# Patient Record
Sex: Female | Born: 2000 | Race: White | Hispanic: No | Smoking: Never smoker
Health system: Southern US, Community
[De-identification: ages and names within clinical notes are randomized; demographics above are authoritative.]

## PROBLEM LIST (undated history)

## (undated) DIAGNOSIS — F909 Attention-deficit hyperactivity disorder, unspecified type: Secondary | ICD-10-CM

## (undated) DIAGNOSIS — R51 Headache: Secondary | ICD-10-CM

## (undated) DIAGNOSIS — G039 Meningitis, unspecified: Secondary | ICD-10-CM

## (undated) DIAGNOSIS — B019 Varicella without complication: Secondary | ICD-10-CM

## (undated) DIAGNOSIS — F411 Generalized anxiety disorder: Secondary | ICD-10-CM

## (undated) DIAGNOSIS — R197 Diarrhea, unspecified: Secondary | ICD-10-CM

## (undated) HISTORY — DX: Generalized anxiety disorder: F41.1

## (undated) HISTORY — DX: Diarrhea, unspecified: R19.7

---
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
## 2020-01-26 DIAGNOSIS — Z113 Encounter for screening for infections with a predominantly sexual mode of transmission: Secondary | ICD-10-CM | POA: Diagnosis not present

## 2020-02-09 ENCOUNTER — Encounter: Payer: Self-pay | Admitting: *Deleted

## 2020-02-09 ENCOUNTER — Other Ambulatory Visit: Payer: Self-pay

## 2020-02-09 DIAGNOSIS — Z20822 Contact with and (suspected) exposure to covid-19: Secondary | ICD-10-CM | POA: Diagnosis present

## 2020-02-09 DIAGNOSIS — Z7722 Contact with and (suspected) exposure to environmental tobacco smoke (acute) (chronic): Secondary | ICD-10-CM | POA: Diagnosis present

## 2020-02-09 DIAGNOSIS — N730 Acute parametritis and pelvic cellulitis: Secondary | ICD-10-CM | POA: Diagnosis not present

## 2020-02-09 DIAGNOSIS — N83201 Unspecified ovarian cyst, right side: Secondary | ICD-10-CM | POA: Diagnosis not present

## 2020-02-09 DIAGNOSIS — R109 Unspecified abdominal pain: Secondary | ICD-10-CM | POA: Diagnosis not present

## 2020-02-09 DIAGNOSIS — N739 Female pelvic inflammatory disease, unspecified: Secondary | ICD-10-CM | POA: Diagnosis not present

## 2020-02-09 DIAGNOSIS — F419 Anxiety disorder, unspecified: Secondary | ICD-10-CM | POA: Diagnosis not present

## 2020-02-09 DIAGNOSIS — R1030 Lower abdominal pain, unspecified: Secondary | ICD-10-CM | POA: Diagnosis not present

## 2020-02-09 DIAGNOSIS — N7093 Salpingitis and oophoritis, unspecified: Principal | ICD-10-CM | POA: Diagnosis present

## 2020-02-09 LAB — CBC
HCT: 35.3 % — ABNORMAL LOW (ref 36.0–46.0)
Hemoglobin: 11.5 g/dL — ABNORMAL LOW (ref 12.0–15.0)
MCH: 26.9 pg (ref 26.0–34.0)
MCHC: 32.6 g/dL (ref 30.0–36.0)
MCV: 82.5 fL (ref 80.0–100.0)
Platelets: 279 10*3/uL (ref 150–400)
RBC: 4.28 MIL/uL (ref 3.87–5.11)
RDW: 13.1 % (ref 11.5–15.5)
WBC: 9.7 10*3/uL (ref 4.0–10.5)
nRBC: 0 % (ref 0.0–0.2)

## 2020-02-09 LAB — COMPREHENSIVE METABOLIC PANEL
ALT: 22 U/L (ref 0–44)
AST: 17 U/L (ref 15–41)
Albumin: 3.8 g/dL (ref 3.5–5.0)
Alkaline Phosphatase: 66 U/L (ref 38–126)
Anion gap: 11 (ref 5–15)
BUN: 8 mg/dL (ref 6–20)
CO2: 22 mmol/L (ref 22–32)
Calcium: 9 mg/dL (ref 8.9–10.3)
Chloride: 100 mmol/L (ref 98–111)
Creatinine, Ser: 0.82 mg/dL (ref 0.44–1.00)
GFR, Estimated: >60 mL/min (ref >60)
Glucose, Bld: 116 mg/dL — ABNORMAL HIGH (ref 70–99)
Potassium: 3.8 mmol/L (ref 3.5–5.1)
Sodium: 133 mmol/L — ABNORMAL LOW (ref 135–145)
Total Bilirubin: 0.7 mg/dL (ref 0.3–1.2)
Total Protein: 7.7 g/dL (ref 6.5–8.1)

## 2020-02-09 LAB — URINALYSIS, COMPLETE (UACMP) WITH MICROSCOPIC
Bacteria, UA: NONE SEEN
Bilirubin Urine: NEGATIVE
Glucose, UA: NEGATIVE mg/dL
Ketones, ur: NEGATIVE mg/dL
Nitrite: NEGATIVE
Protein, ur: NEGATIVE mg/dL
Specific Gravity, Urine: 1.018 (ref 1.005–1.030)
pH: 6 (ref 5.0–8.0)

## 2020-02-09 LAB — PREGNANCY, URINE: Preg Test, Ur: NEGATIVE

## 2020-02-09 LAB — LIPASE, BLOOD: Lipase: 19 U/L (ref 11–51)

## 2020-02-09 MED ORDER — ACETAMINOPHEN 325 MG PO TABS
650.0000 mg | ORAL_TABLET | Freq: Once | ORAL | Status: AC | PRN
  Administered 2020-02-09: 650 mg via ORAL
  Filled 2020-02-09: qty 2

## 2020-02-09 NOTE — ED Triage Notes (Signed)
Pt to ED reporting abd pain x 1 week. Pt originally thought she was constipated and took Mirilax without relief. Pt has since started to develop a fever, lightheadedness and nausea without vomiting. Pt denies having taken medication for her fever today.   Pt reporting the pain feels similar to when she had her IUD placed. Stabbing lower abd but pt denies having vaginal pain.

## 2020-02-10 ENCOUNTER — Inpatient Hospital Stay: Payer: BC Managed Care – PPO

## 2020-02-10 ENCOUNTER — Emergency Department: Payer: BC Managed Care – PPO

## 2020-02-10 ENCOUNTER — Inpatient Hospital Stay
Admission: EM | Admit: 2020-02-10 | Discharge: 2020-02-14 | DRG: 747 | Disposition: A | Discharge status: Home / Self Care | Payer: BC Managed Care – PPO | Attending: Obstetrics & Gynecology | Admitting: Obstetrics & Gynecology

## 2020-02-10 DIAGNOSIS — F419 Anxiety disorder, unspecified: Secondary | ICD-10-CM | POA: Diagnosis present

## 2020-02-10 DIAGNOSIS — R1030 Lower abdominal pain, unspecified: Secondary | ICD-10-CM | POA: Diagnosis present

## 2020-02-10 DIAGNOSIS — Z7722 Contact with and (suspected) exposure to environmental tobacco smoke (acute) (chronic): Secondary | ICD-10-CM | POA: Diagnosis present

## 2020-02-10 DIAGNOSIS — N730 Acute parametritis and pelvic cellulitis: Secondary | ICD-10-CM | POA: Diagnosis not present

## 2020-02-10 DIAGNOSIS — N7093 Salpingitis and oophoritis, unspecified: Secondary | ICD-10-CM

## 2020-02-10 DIAGNOSIS — Z20822 Contact with and (suspected) exposure to covid-19: Secondary | ICD-10-CM | POA: Diagnosis present

## 2020-02-10 LAB — CHLAMYDIA/NGC RT PCR (ARMC ONLY)
Chlamydia Tr: NOT DETECTED
N gonorrhoeae: NOT DETECTED

## 2020-02-10 LAB — RESP PANEL BY RT-PCR (FLU A&B, COVID) ARPGX2
Influenza A by PCR: NEGATIVE
Influenza B by PCR: NEGATIVE
SARS Coronavirus 2 by RT PCR: NEGATIVE

## 2020-02-10 LAB — WET PREP, GENITAL
Sperm: NONE SEEN
Trich, Wet Prep: NONE SEEN
Yeast Wet Prep HPF POC: NONE SEEN

## 2020-02-10 MED ORDER — LORAZEPAM 2 MG/ML IJ SOLN: 0.5000 mg | Freq: Once | INTRAMUSCULAR | Status: DC

## 2020-02-10 MED ORDER — SODIUM CHLORIDE 0.9 % IV SOLN
3.0000 g | Freq: Four times a day (QID) | INTRAVENOUS | Status: DC
  Administered 2020-02-10 – 2020-02-14 (×18): 3 g via INTRAVENOUS
  Filled 2020-02-10: qty 8
  Filled 2020-02-10: qty 3
  Filled 2020-02-10 (×3): qty 8
  Filled 2020-02-10 (×2): qty 3
  Filled 2020-02-10: qty 8
  Filled 2020-02-10 (×7): qty 3
  Filled 2020-02-10 (×2): qty 8
  Filled 2020-02-10 (×4): qty 3

## 2020-02-10 MED ORDER — ZOLPIDEM TARTRATE 5 MG PO TABS
5.0000 mg | ORAL_TABLET | Freq: Every evening | ORAL | Status: DC | PRN
  Administered 2020-02-13: 5 mg via ORAL
  Filled 2020-02-10: qty 1

## 2020-02-10 MED ORDER — LACTATED RINGERS IV SOLN: INTRAVENOUS | Status: DC

## 2020-02-10 MED ORDER — OXYCODONE-ACETAMINOPHEN 5-325 MG PO TABS
1.0000 | ORAL_TABLET | ORAL | Status: DC | PRN
Start: 2020-02-10 — End: 2020-02-10

## 2020-02-10 MED ORDER — MORPHINE SULFATE (PF) 2 MG/ML IV SOLN
1.0000 mg | INTRAVENOUS | Status: DC | PRN
  Administered 2020-02-10: 2 mg via INTRAVENOUS
  Filled 2020-02-10: qty 1

## 2020-02-10 MED ORDER — MORPHINE SULFATE (PF) 4 MG/ML IV SOLN
4.0000 mg | Freq: Once | INTRAVENOUS | Status: AC
Start: 2020-02-10 — End: 2020-02-10
  Administered 2020-02-10: 02:00:00 4 mg via INTRAVENOUS
  Filled 2020-02-10: qty 1

## 2020-02-10 MED ORDER — IOHEXOL 350 MG/ML SOLN
75.0000 mL | Freq: Once | INTRAVENOUS | Status: AC | PRN
  Administered 2020-02-10: 02:00:00 75 mL via INTRAVENOUS

## 2020-02-10 MED ORDER — OXYCODONE HCL 5 MG PO TABS
5.0000 mg | ORAL_TABLET | ORAL | Status: DC | PRN
  Administered 2020-02-10 – 2020-02-11 (×5): 5 mg via ORAL
  Filled 2020-02-10 (×6): qty 1

## 2020-02-10 MED ORDER — ALPRAZOLAM 0.5 MG PO TABS
0.5000 mg | ORAL_TABLET | Freq: Two times a day (BID) | ORAL | Status: DC | PRN
  Administered 2020-02-10 – 2020-02-14 (×4): 0.5 mg via ORAL
  Filled 2020-02-10 (×5): qty 1

## 2020-02-10 MED ORDER — LACTATED RINGERS IV BOLUS
1000.0000 mL | Freq: Once | INTRAVENOUS | Status: AC
  Administered 2020-02-10: 02:00:00 1000 mL via INTRAVENOUS

## 2020-02-10 MED ORDER — DOXYCYCLINE HYCLATE 100 MG PO TABS
100.0000 mg | ORAL_TABLET | Freq: Two times a day (BID) | ORAL | Status: DC
  Administered 2020-02-10 – 2020-02-14 (×8): 100 mg via ORAL
  Filled 2020-02-10 (×10): qty 1

## 2020-02-10 MED ORDER — PRENATAL PLUS 27-1 MG PO TABS
1.0000 | ORAL_TABLET | Freq: Every day | ORAL | Status: DC
  Administered 2020-02-13 – 2020-02-14 (×2): 1 via ORAL
  Filled 2020-02-10 (×3): qty 1

## 2020-02-10 MED ORDER — SODIUM CHLORIDE 0.9 % IV SOLN
100.0000 mg | Freq: Once | INTRAVENOUS | Status: AC
  Administered 2020-02-10: 06:00:00 100 mg via INTRAVENOUS
  Filled 2020-02-10: qty 100

## 2020-02-10 MED ORDER — ACETAMINOPHEN 325 MG PO TABS
650.0000 mg | ORAL_TABLET | ORAL | Status: DC | PRN
  Administered 2020-02-10 – 2020-02-14 (×10): 650 mg via ORAL
  Filled 2020-02-10 (×12): qty 2

## 2020-02-10 NOTE — H&P (Signed)
Obstetrics & Gynecology History and Physical Note  Date of Consultation: 02/10/2020   Requesting Provider: [facility]  Primary OBGYN: [facility] Primary Care Provider: System, Provider Not In  Reason for Consultation: Pain  History of Present Illness: [name] Green is a 19 y.o. G0 (No LMP recorded.), with the above CC. Pt presents with c/o lower abdominal pain that has been present for one week but acutely worsened during the night.  Described as sharp, constant, no radiation, associated with fever, chills, nausea.  No modifiers, came to ER.  No VB or discharge.  Prior h/o: IUD placed 11/2018, no problems w IUD. Chlamydia infection dx and tx 02/2019. No prior pregnancies. No prior abdominal surgeries. Colitis dx and treated few years ago, but no longer on meds nor has sx's from this.  ROS: A review of systems was performed and was complete and comprehensive, except as stated in the above HPI.  OBGYN History: As per HPI. OB History   No obstetric history on file.      Past Medical History: Past Medical History:  Diagnosis Date  . ADHD (attention deficit hyperactivity disorder)   . Anxiety state 01/01/2018  . Asthma   . Bloody diarrhea    With mucous  . Headache(784.0)    Tues and Fever-100.6  . Meningitis    as an inafant  . Varicella     Past Surgical History: Past Surgical History:  Procedure Laterality Date  . ADENOIDECTOMY  2008  . COLONOSCOPY  04/18/2011   Procedure: COLONOSCOPY;  Surgeon: Jon Gills, MD;  Location: [facility] OR;  Service: Gastroenterology;  Laterality: N/A;  . TONSILLECTOMY  2008    Family History:  Family History  Problem Relation Age of Onset  . ADD / ADHD Sister   . Anxiety disorder Sister   . Anxiety disorder Sister   . ADD / ADHD Brother   . Anxiety disorder Brother   . Ulcers Father   . ADD / ADHD Mother   . Anxiety disorder Mother   . Migraines Maternal Grandmother   . Depression Paternal Grandmother   . Bipolar disorder Paternal Grandfather    . Seizures Neg Hx   . Autism Neg Hx   . Schizophrenia Neg Hx    She denies any female cancers, bleeding or blood clotting disorders.   Social History:  Social History   Socioeconomic History  . Marital status: Single    Spouse name: Not on file  . Number of children: Not on file  . Years of education: Not on file  . Highest education level: Not on file  Occupational History  . Not on file  Tobacco Use  . Smoking status: Passive Smoke Exposure - Never Smoker  . Smokeless tobacco: Never Used  Substance and Sexual Activity  . Alcohol use: No  . Drug use: No  . Sexual activity: Never  Other Topics Concern  . Not on file  Social History Narrative   Lives at home with mom, and twin sister. She is in the 12th grade at [employer]. She enjoys cheering eating and hanging out with friends.    Social Determinants of Health   Financial Resource Strain: Not on file  Food Insecurity: Not on file  Transportation Needs: Not on file  Physical Activity: Not on file  Stress: Not on file  Social Connections: Not on file  Intimate Partner Violence: Not on file    Allergy: Allergies  Allergen Reactions  . Sulfur Rash    Current Outpatient  Medications: (Not in a hospital admission)   Hospital Medications: Current Facility-Administered Medications  Medication Dose Route Frequency Provider Last Rate Last Admin  . acetaminophen (TYLENOL) tablet 650 mg  650 mg Oral Q4H PRN Nadara Mustard, MD      . ALPRAZolam Prudy Feeler) tablet 0.5 mg  0.5 mg Oral BID PRN Nadara Mustard, MD      . Ampicillin-Sulbactam (UNASYN) 3 g in sodium chloride 0.9 % 100 mL IVPB  3 g Intravenous Q6H Nadara Mustard, MD 200 mL/hr at 02/10/20 0405 3 g at 02/10/20 0405  . doxycycline (VIBRAMYCIN) 100 mg in sodium chloride 0.9 % 250 mL IVPB  100 mg Intravenous Once Nadara Mustard, MD      . lactated ringers infusion   Intravenous Continuous Nadara Mustard, MD      . morphine 2 MG/ML injection 1-2 mg   1-2 mg Intravenous Q3H PRN Nadara Mustard, MD      . oxyCODONE-acetaminophen (PERCOCET/ROXICET) 5-325 MG per tablet 1-2 tablet  1-2 tablet Oral Q3H PRN Nadara Mustard, MD      . prenatal multivitamin tablet 1 tablet  1 tablet Oral Q1200 Nadara Mustard, MD      . zolpidem (AMBIEN) tablet 5 mg  5 mg Oral QHS PRN Nadara Mustard, MD       Current Outpatient Medications  Medication Sig Dispense Refill  . polyethylene glycol (MIRALAX / GLYCOLAX) 17 g packet Take 17 g by mouth daily.      Physical Exam: Vitals:   02/09/20 1916 02/09/20 2231 02/10/20 0106 02/10/20 0201  BP:  (!) 118/56  (!) 122/56  Pulse:  (!) 102  (!) 103  Resp:  18  16  Temp:  99.1 F (37.3 C) 99.2 F (37.3 C)   TempSrc:  Oral Oral   SpO2:  98%  100%  Weight: 90.7 kg     Height: 5\' 2"  (1.575 m)       Temp:  [99.1 F (37.3 C)-101.3 F (38.5 C)] 99.2 F (37.3 C) (12/17 0106) Pulse Rate:  [102-136] 103 (12/17 0201) Resp:  [16-18] 16 (12/17 0201) BP: (118-122)/(56-68) 122/56 (12/17 0201) SpO2:  [97 %-100 %] 100 % (12/17 0201) Weight:  [90.7 kg] 90.7 kg (12/16 1916) No intake/output data recorded. No intake/output data recorded. No intake or output data in the 24 hours ending 02/10/20 0427  Body mass index is 36.58 kg/m. Constitutional: Well nourished, well developed female in no acute distress.  HEENT: normal Neck:  Supple, normal appearance, and no thyromegaly  Cardiovascular:Regular rate and rhythm.  No murmurs, rubs or gallops. Respiratory:  Clear to auscultation bilateral. Normal respiratory effort Abdomen: positive bowel sounds and no masses, hernias; diffusely non tender to palpation, non distended Neuro: grossly intact Psych:  Normal mood and affect.  Skin:  Warm and dry.  MS: normal gait and normal bilateral lower extremity strength/ROM/symmetry Lymphatic:  No inguinal lymphadenopathy.   Pelvic exam: deferred by self MD exam (ER) CMT; no discharge  Recent Labs  Lab 02/09/20 1918  WBC  9.7  HGB 11.5*  HCT 35.3*  PLT 279   Recent Labs  Lab 02/09/20 1918  NA 133*  K 3.8  CL 100  CO2 22  BUN 8  CREATININE 0.82  CALCIUM 9.0  PROT 7.7  BILITOT 0.7  ALKPHOS 66  ALT 22  AST 17  GLUCOSE 116*  uCG NEG  Imaging:  CT Reproductive: The right fallopian flu is dilated, thick-walled, and demonstrates mild  peritubal inflammatory stranding in keeping with a probable pyosalpinx/tubo-ovarian abscess. Small internal septa within the dilated fallopian tube or best appreciated on sagittal image # 62. The ovary is best appreciated on axial image # 62 and coronal image # 47 adjacent to the dilated, inflamed fallopian tube. The developing abscess measures roughly 4.7 x 5.2 by 4.6 cm in greatest dimension. Intrauterine device noted within the endometrial cavity. Left adnexa is unremarkable.  Assessment: Ms. Fulco is a 19 y.o. G0 (No LMP recorded.) who presented to the ED with complaints of pelvic pain; findings are consistent with Tubo Ovarian Abscess.  Plan: Admission status due to pain, nausea, abscess diagnosed by CT IVF, IV ABX    Regimen- Unasyn and Doxy    Plan for 48 hours then convert to po Doxy for 14 day outpatient therapy Monitor for fever and pain Surgery option discussed, deferred 48 hours and if improved symptoms, then no surgery    If worsens in sx's, or growth in TOA as demonstrated by [place], then surgery [place] for baseline today Diet, ambuilation  Discussed likely etiology to be chlamydia.  Expectations for therapy, recovery, future fertility, PID and CPP, all discussed.  Mother present for discussion as well.  IUD in proper location (by CT), no need to remove at this time.  UTI, BV suggested by labs.  Broad spectrum ABX to cover.  No sx's related to these, may be cross contaminations.  Anxiety, will have prn meds to help while hospital bound.  Annamarie Major, MD, [facility], [facility] 02/10/2020  4:27 AM Pager [phone]

## 2020-02-10 NOTE — ED Notes (Signed)
See triage note. Pt denies pain with urination, last bowel movement was yesterday. Pt states normal for her is every two day. Pt had Marena IUD placed approx 1 year ago and states current pain is similar to that experience after placement. Pt has had intermittent fever for last 48 hours.

## 2020-02-10 NOTE — ED Notes (Signed)
Report to Kennedi, RN

## 2020-02-10 NOTE — ED Provider Notes (Signed)
[facility] Emergency Department Provider Note   ____________________________________________   Event Date/Time   First MD Initiated Contact with Patient 02/10/20 0112     (approximate)  I have reviewed the triage vital signs and the nursing notes.   HISTORY  Chief Complaint Abdominal Pain    HPI [name] Green is a 19 y.o. female with past medical history of asthma, anxiety, and ADHD who presents to the ED complaining of abdominal pain.  Patient reports she has had approximately 1 week of gradually worsening pain to her bilateral pelvic area.  Pain is described as sharp and constant, not exacerbated or alleviated by anything.  She initially thought she might be constipated, but she took MiraLAX and had a bowel movement with no relief.  She started having chills and lightheadedness earlier today, mom then took her temperature and found it to be 102.  She has had some nausea, but denies any vomiting or changes in bowel movements.  She states the pain in her abdomen seems to be worse when she goes to pee, but she does not have any dysuria or hematuria.  She denies any vaginal bleeding or discharge.        Past Medical History:  Diagnosis Date  . ADHD (attention deficit hyperactivity disorder)   . Anxiety state 01/01/2018  . Asthma   . Bloody diarrhea    With mucous  . Headache(784.0)    Tues and Fever-100.6  . Meningitis    as an inafant  . Varicella     Patient Active Problem List   Diagnosis Date Noted  . Postconcussion syndrome 01/01/2018  . Anxiety state 01/01/2018  . Sleeping difficulty 01/01/2018  . Left sided colitis (HCC) 02/02/2012  . Abnormal urine odor 01/19/2012  . Stool culture positive for Clostridium difficile 11/26/2011  . Simple constipation 11/24/2011  . Intestinal bacterial overgrowth 10/13/2011  . Generalized abdominal pain 09/22/2011  . Bloody diarrhea     Past Surgical History:  Procedure Laterality Date  .  ADENOIDECTOMY  2008  . COLONOSCOPY  04/18/2011   Procedure: COLONOSCOPY;  Surgeon: Jon Gills, MD;  Location: [facility] OR;  Service: Gastroenterology;  Laterality: N/A;  . TONSILLECTOMY  2008    Prior to Admission medications   Medication Sig Start Date End Date Taking? Authorizing Provider  albuterol (PROVENTIL HFA;VENTOLIN HFA) 108 (90 BASE) MCG/ACT inhaler Inhale 2 puffs into the lungs every 6 (six) hours as needed. For shortness of breath     [provider]  cetirizine (ZYRTEC) 10 MG tablet TAKE 1 TABLET BY MOUTH ONCE DAILY 06/19/16   [provider]  cholestyramine Lanetta Inch) 4 GM/DOSE powder Take 0.5 packets (2 g total) by mouth 2 (two) times daily with a meal. 02/05/12 02/04/13  Jon Gills, MD  dexmethylphenidate (FOCALIN XR) 10 MG 24 hr capsule Take 10 mg by mouth daily.      [provider]  escitalopram (LEXAPRO) 5 MG tablet TAKE 1 TABLET(S) BY MOUTH AT BEDTIME FOR ANXIETY/DEPRESSION 12/24/17   [provider]  famotidine (PEPCID) 20 MG tablet TAKE 1 TABLET BY MOUTH DAILY 06/16/16   [provider]  Fiber CHEW Chew by mouth.    [provider]  hydrOXYzine (ATARAX/VISTARIL) 25 MG tablet TAKE 1 TABLET(S) BY MOUTH TWICE A DAY AS NEEDED FOR ANXIETY/SLEEP 12/24/17   [provider]  ibuprofen (ADVIL,MOTRIN) 600 MG tablet Take by mouth. 10/08/14   [provider]  levonorgestrel-ethinyl estradiol (LILLOW) 0.15-30 MG-MCG tablet TAKE 1  TABLET BY MOUTH ONCE DAILY 07/01/16   [provider]  mesalamine (PENTASA) 500 MG CR capsule Take 1 capsule (500 mg total) by mouth 3 (three) times daily. 12/25/11 12/24/12  Jon Gills, MD  montelukast (SINGULAIR) 5 MG chewable tablet Chew 5 mg by mouth at bedtime.      [provider]  pantoprazole (PROTONIX) 20 MG tablet Take by mouth. 07/07/17   [provider]  saccharomyces boulardii (FLORASTOR) 250 MG capsule Take 1 capsule (250 mg total) by mouth 2  (two) times daily. 02/02/12 02/01/13  Jon Gills, MD    Allergies Sulfur  Family History  Problem Relation Age of Onset  . ADD / ADHD Sister   . Anxiety disorder Sister   . Anxiety disorder Sister   . ADD / ADHD Brother   . Anxiety disorder Brother   . Ulcers Father   . ADD / ADHD Mother   . Anxiety disorder Mother   . Migraines Maternal Grandmother   . Depression Paternal Grandmother   . Bipolar disorder Paternal Grandfather   . Seizures Neg Hx   . Autism Neg Hx   . Schizophrenia Neg Hx     Social History Social History   Tobacco Use  . Smoking status: Passive Smoke Exposure - Never Smoker  . Smokeless tobacco: Never Used  Substance Use Topics  . Alcohol use: No  . Drug use: No    Review of Systems  Constitutional: Positive for fever/chills Eyes: No visual changes. ENT: No sore throat. Cardiovascular: Denies chest pain. Respiratory: Denies shortness of breath. Gastrointestinal: Positive for pelvic and abdominal pain.  Positive for nausea, no vomiting.  No diarrhea.  No constipation. Genitourinary: Negative for dysuria. Musculoskeletal: Negative for back pain. Skin: Negative for rash. Neurological: Negative for headaches, focal weakness or numbness.  ____________________________________________   PHYSICAL EXAM:  VITAL SIGNS: ED Triage Vitals  Enc Vitals Group     BP 02/09/20 1915 121/68     Pulse Rate 02/09/20 1915 (!) 136     Resp 02/09/20 1915 16     Temp 02/09/20 1915 (!) 101.3 F (38.5 C)     Temp Source 02/09/20 1915 Oral     SpO2 02/09/20 1915 97 %     Weight 02/09/20 1916 200 lb (90.7 kg)     Height 02/09/20 1916 5\' 2"  (1.575 m)     Head Circumference --      Peak Flow --      Pain Score 02/09/20 1916 8     Pain Loc --      Pain Edu? --      Excl. in GC? --     Constitutional: Alert and oriented. Eyes: Conjunctivae are normal. Head: Atraumatic. Nose: No congestion/rhinnorhea. Mouth/Throat: Mucous membranes are moist. Neck: Normal  ROM Cardiovascular: Normal rate, regular rhythm. Grossly normal heart sounds. Respiratory: Normal respiratory effort.  No retractions. Lungs CTAB. Gastrointestinal: Soft and tender to palpation in the bilateral lower quadrants with no rebound or guarding. No distention. Genitourinary: Thick yellow discharge noted with cervical motion tenderness, no adnexal tenderness. Musculoskeletal: No lower extremity tenderness nor edema. Neurologic:  Normal speech and language. No gross focal neurologic deficits are appreciated. Skin:  Skin is warm, dry and intact. No rash noted. Psychiatric: Mood and affect are normal. Speech and behavior are normal.  ____________________________________________   LABS (all labs ordered are listed, but only abnormal results are displayed)  Labs Reviewed  COMPREHENSIVE METABOLIC PANEL - Abnormal; Notable for the following components:  Result Value   Sodium 133 (*)    Glucose, Bld 116 (*)    All other components within normal limits  CBC - Abnormal; Notable for the following components:   Hemoglobin 11.5 (*)    HCT 35.3 (*)    All other components within normal limits  URINALYSIS, COMPLETE (UACMP) WITH MICROSCOPIC - Abnormal; Notable for the following components:   Color, Urine YELLOW (*)    APPearance HAZY (*)    Hgb urine dipstick SMALL (*)    Leukocytes,Ua SMALL (*)    All other components within normal limits  WET PREP, GENITAL  CHLAMYDIA/NGC RT PCR ([facility] ONLY)  CULTURE, BLOOD (ROUTINE X 2)  CULTURE, BLOOD (ROUTINE X 2)  RESP PANEL BY RT-PCR (FLU A&B, COVID) ARPGX2  LIPASE, BLOOD  PREGNANCY, URINE  LACTIC ACID, PLASMA  LACTIC ACID, PLASMA   PROCEDURES  Procedure(s) performed (including Critical Care):  Procedures   ____________________________________________   INITIAL IMPRESSION / ASSESSMENT AND PLAN / ED COURSE       19 year old female with past medical history of asthma, anxiety, and ADHD who presents to the ED  complaining of 1 week of gradually worsening bilateral lower pelvic pain and fever starting earlier today.  Patient noted to be febrile and tachycardic upon arrival to the ED, although she was given Tylenol and fever and tachycardia have since improved.  She does have focal tenderness to bilateral lower quadrants, pelvic exam reveals cervical motion tenderness with purulent discharge.  CT scan was performed and shows fluid collection concerning for tubo-ovarian abscess.  We will add on blood cultures and treat with Unasyn and doxycycline.  Case discussed with Dr. Tiburcio Pea of OB/GYN, who accepts patient for admission.      ____________________________________________   FINAL CLINICAL IMPRESSION(S) / ED DIAGNOSES  Final diagnoses:  TOA (tubo-ovarian abscess)  PID (acute pelvic inflammatory disease)     ED Discharge Orders    None       Note:  This document was prepared using Dragon voice recognition software and may include unintentional dictation errors.   Chesley Noon, MD 02/10/20 [id number]

## 2020-02-10 NOTE — Progress Notes (Signed)
Pt feeling much better this afternoon after treating her fever with Tylenol and getting the antibiotics started. She has been able to eat lunch and drink lots of water. She has rested quite a bit. Planning to shower now.   Pain is tolerable now. She received 2mg  of morphine at 0953 this morning due to 7/10 sharp, shooting pain. Declined anymore morphine this afternoon.  BP is still on the lower side but pt not reporting any symptoms from that.   **Pt requested that no information regarding her care be talked about in front of her grandmother. Her mother is aware of everything but grandmother is not.

## 2020-02-10 NOTE — ED Notes (Signed)
Pt tearful and complaining of severe anxiety. Dr Larinda Buttery notified and gave verbal orders for 0.5mg  ativan.

## 2020-02-11 DIAGNOSIS — N730 Acute parametritis and pelvic cellulitis: Secondary | ICD-10-CM

## 2020-02-11 DIAGNOSIS — N7093 Salpingitis and oophoritis, unspecified: Secondary | ICD-10-CM

## 2020-02-11 MED ORDER — ONDANSETRON HCL 4 MG/2ML IJ SOLN
4.0000 mg | Freq: Four times a day (QID) | INTRAMUSCULAR | Status: DC | PRN
  Administered 2020-02-11 – 2020-02-12 (×2): 4 mg via INTRAVENOUS
  Filled 2020-02-11 (×2): qty 2

## 2020-02-11 MED ORDER — BISACODYL 10 MG RE SUPP: 10.0000 mg | Freq: Every day | RECTAL | Status: DC | PRN

## 2020-02-11 MED ORDER — GUAIFENESIN ER 600 MG PO TB12
600.0000 mg | ORAL_TABLET | Freq: Two times a day (BID) | ORAL | Status: DC | PRN
  Administered 2020-02-11: 04:00:00 600 mg via ORAL
  Filled 2020-02-11 (×2): qty 1

## 2020-02-11 MED ORDER — ALBUTEROL SULFATE HFA 108 (90 BASE) MCG/ACT IN AERS
1.0000 | INHALATION_SPRAY | RESPIRATORY_TRACT | Status: DC | PRN
  Administered 2020-02-11 – 2020-02-12 (×3): 2 via RESPIRATORY_TRACT
  Filled 2020-02-11 (×2): qty 6.7

## 2020-02-11 MED ORDER — POLYETHYLENE GLYCOL 3350 17 G PO PACK
17.0000 g | PACK | Freq: Every day | ORAL | Status: DC
  Administered 2020-02-11 – 2020-02-14 (×3): 17 g via ORAL
  Filled 2020-02-11 (×4): qty 1

## 2020-02-11 MED ORDER — OXYCODONE HCL 5 MG PO TABS
5.0000 mg | ORAL_TABLET | ORAL | Status: DC | PRN
  Administered 2020-02-11: 10 mg via ORAL
  Administered 2020-02-11 – 2020-02-14 (×12): 5 mg via ORAL
  Filled 2020-02-11 (×2): qty 1
  Filled 2020-02-11: qty 2
  Filled 2020-02-11 (×4): qty 1
  Filled 2020-02-11: qty 2
  Filled 2020-02-11 (×5): qty 1

## 2020-02-11 NOTE — Progress Notes (Signed)
Obstetric and Gynecology  Subjective  Feeling better than on admission.  Was febrile overnight does not feel febrile now and no elevated temps since.  She has not had a bowl movement in 5-7 days.  She is tolerating po.  Some light vaginal bleeding reported.    Objective  Vital signs in last 24 hours: Temp:  [97.8 F (36.6 C)-102.1 F (38.9 C)] 97.9 F (36.6 C) (12/18 0833) Pulse Rate:  [71-117] 86 (12/18 0833) Resp:  [16-20] 18 (12/18 0833) BP: (96-113)/(52-64) 98/54 (12/18 0833) SpO2:  [94 %-100 %] 100 % (12/18 0737)     Intake/Output Summary (Last 24 hours) at 02/11/2020 0938 Last data filed at 02/11/2020 1062 Gross per 24 hour  Intake 2961.81 ml  Output 3950 ml  Net -988.19 ml    General:NAD Pulmonary: no increased work of breathing Abdomen: soft, non-distended, right lower quadrant tenderness to medium depth palpation, +rebound, no guarding Extremities:no edema  Labs: No results found for this or any previous visit (from the past 24 hour(s)).  Cultures: Results for orders placed or performed during the hospital encounter of 02/10/20  Wet prep, genital     Status: Abnormal   Collection Time: 02/10/20  3:30 AM  Result Value Ref Range Status   Yeast Wet Prep HPF POC NONE SEEN NONE SEEN Final   Trich, Wet Prep NONE SEEN NONE SEEN Final   Clue Cells Wet Prep HPF POC PRESENT (A) NONE SEEN Final   WBC, Wet Prep HPF POC MANY (A) NONE SEEN Final   Sperm NONE SEEN  Final    Comment: Performed at [facility], [address] [place], [place] [address]  Chlamydia/NGC rt PCR [facility] only)     Status: None   Collection Time: 02/10/20  3:30 AM  Result Value Ref Range Status   Specimen source GC/Chlam CTNG  Final   Chlamydia Tr NOT DETECTED NOT DETECTED Final   N gonorrhoeae NOT DETECTED NOT DETECTED Final    Comment: (NOTE) This CT/NG assay has not been evaluated in patients with a history of  hysterectomy. Performed at [facility], [address]  [place], [place] [address]   Culture, blood (routine x 2)     Status: None (Preliminary result)   Collection Time: 02/10/20  3:55 AM   Specimen: BLOOD  Result Value Ref Range Status   Specimen Description BLOOD RIGHT ASSIST CONTROL  Final   Special Requests   Final    BOTTLES DRAWN AEROBIC AND ANAEROBIC Blood Culture results may not be optimal due to an inadequate volume of blood received in culture bottles   Culture   Final    NO GROWTH 1 DAY Performed at [facility], [address] [place], [place] [address]    Report Status PENDING  Incomplete  Culture, blood (routine x 2)     Status: None (Preliminary result)   Collection Time: 02/10/20  3:55 AM   Specimen: BLOOD  Result Value Ref Range Status   Specimen Description BLOOD LEFT HAND  Final   Special Requests   Final    BOTTLES DRAWN AEROBIC AND ANAEROBIC Blood Culture results may not be optimal due to an inadequate volume of blood received in culture bottles   Culture   Final    NO GROWTH 1 DAY Performed at [facility], [address] [place], [place] [address]    Report Status PENDING  Incomplete  Resp Panel by RT-PCR (Flu A&B, Covid) Nasopharyngeal Swab     Status:  None   Collection Time: 02/10/20  3:55 AM   Specimen: Nasopharyngeal Swab; Nasopharyngeal(NP) swabs in vial transport medium  Result Value Ref Range Status   SARS Coronavirus 2 by RT PCR NEGATIVE NEGATIVE Final    Comment: (NOTE) SARS-CoV-2 target nucleic acids are NOT DETECTED.  The SARS-CoV-2 RNA is generally detectable in upper respiratory specimens during the acute phase of infection. The lowest concentration of SARS-CoV-2 viral copies this assay can detect is 138 copies/mL. A negative result does not preclude SARS-Cov-2 infection and should not be used as the sole basis for treatment or other patient management decisions. A negative result may occur with  improper specimen collection/handling, submission of specimen other than  nasopharyngeal swab, presence of viral mutation(s) within the areas targeted by this assay, and inadequate number of viral copies(<138 copies/mL). A negative result must be combined with clinical observations, patient history, and epidemiological information. The expected result is Negative.  Fact Sheet for Patients:  BloggerCourse.com  Fact Sheet for Healthcare Providers:  SeriousBroker.it  This test is no t yet approved or cleared by the [place] FDA and  has been authorized for detection and/or diagnosis of SARS-CoV-2 by FDA under an Emergency Use Authorization (EUA). This EUA will remain  in effect (meaning this test can be used) for the duration of the COVID-19 declaration under Section 564(b)(1) of the Act, 21 U.S.C.section 360bbb-3(b)(1), unless the authorization is terminated  or revoked sooner.       Influenza A by PCR NEGATIVE NEGATIVE Final   Influenza B by PCR NEGATIVE NEGATIVE Final    Comment: (NOTE) The Xpert Xpress SARS-CoV-2/FLU/RSV plus assay is intended as an aid in the diagnosis of influenza from Nasopharyngeal swab specimens and should not be used as a sole basis for treatment. Nasal washings and aspirates are unacceptable for Xpert Xpress SARS-CoV-2/FLU/RSV testing.  Fact Sheet for Patients: BloggerCourse.com  Fact Sheet for Healthcare Providers: SeriousBroker.it  This test is not yet approved or cleared by the [place] FDA and has been authorized for detection and/or diagnosis of SARS-CoV-2 by FDA under an Emergency Use Authorization (EUA). This EUA will remain in effect (meaning this test can be used) for the duration of the COVID-19 declaration under Section 564(b)(1) of the Act, 21 U.S.C. section 360bbb-3(b)(1), unless the authorization is terminated or revoked.  Performed at [facility], [address], [place], [place]  [address]     Imaging:  Assessment   19 y.o. HD#2 culture negative PID/TOA  Plan    1) PID/TOA - continue Unasyn and doxycyline - Will repeat CBC in AM - Still tender and rebound but improved from admission.  Last fever at  102.43F at 22:09.  Discussed discharge criteria will require afebrile for 24-hrs and improvement in pain.  If this fails at the 48-hrs mark we discussed extended course of IV antibiotics, IR drainage, or surgical drainage/salpingectomy - we also discussed IUD with data supporting continuation of IUD with treatment no need for removal  2) FEN - general diet, will make NPO at midnight should IR procedure or surgery be required tomorrow

## 2020-02-11 NOTE — Progress Notes (Signed)
Requested home med Albuterol from Dr. Bonney Aid. Order put in.   Attempting to hold off on Tylenol as of 1000 unless pt develops fever. Pt in agreement.

## 2020-02-11 NOTE — Progress Notes (Addendum)
Pt complaining of "sharp, shooting pain in mid, lower abdomen" that lasts for approx. 10 seconds. Upon assessment, pt feeling warm to touch, temperature of 101.8. Pt given tylenol and roxi. Dr. Bonney Aid paged and made aware.

## 2020-02-11 NOTE — Progress Notes (Signed)
Pt showered and got out crying/overwhelmed.   Gave roxy as soon as patient could have it.   Encouraged pt to sleep and see how she feels (give roxy time to kick in). Pt in agreement with plan.

## 2020-02-11 NOTE — Progress Notes (Signed)
Pt called out stating she "doesn't feel right". Upon assessment, pts arms feel weak, lightheaded and new onset of nausea. Vitals stable. Dr. Darci Needle paged. New orders placed for IV zofran.

## 2020-02-12 ENCOUNTER — Inpatient Hospital Stay: Payer: BC Managed Care – PPO | Admitting: Registered Nurse

## 2020-02-12 ENCOUNTER — Encounter
Admission: EM | Disposition: A | Discharge status: Home / Self Care | Payer: Self-pay | Source: Home / Self Care | Attending: Obstetrics & Gynecology

## 2020-02-12 ENCOUNTER — Encounter: Payer: Self-pay | Admitting: Obstetrics & Gynecology

## 2020-02-12 DIAGNOSIS — N730 Acute parametritis and pelvic cellulitis: Secondary | ICD-10-CM

## 2020-02-12 HISTORY — PX: LAPAROSCOPY: SHX197

## 2020-02-12 LAB — CBC
HCT: 31.1 % — ABNORMAL LOW (ref 36.0–46.0)
Hemoglobin: 10.2 g/dL — ABNORMAL LOW (ref 12.0–15.0)
MCH: 27.5 pg (ref 26.0–34.0)
MCHC: 32.8 g/dL (ref 30.0–36.0)
MCV: 83.8 fL (ref 80.0–100.0)
Platelets: 218 10*3/uL (ref 150–400)
RBC: 3.71 MIL/uL — ABNORMAL LOW (ref 3.87–5.11)
RDW: 13.2 % (ref 11.5–15.5)
WBC: 8.4 10*3/uL (ref 4.0–10.5)
nRBC: 0 % (ref 0.0–0.2)

## 2020-02-12 SURGERY — LAPAROSCOPY OPERATIVE
Anesthesia: General | Site: Abdomen | Laterality: Right

## 2020-02-12 MED ORDER — DOCUSATE SODIUM 100 MG PO CAPS
100.0000 mg | ORAL_CAPSULE | Freq: Two times a day (BID) | ORAL | Status: DC
  Administered 2020-02-12 – 2020-02-14 (×4): 100 mg via ORAL
  Filled 2020-02-12 (×4): qty 1

## 2020-02-12 MED ORDER — GLYCOPYRROLATE 0.2 MG/ML IJ SOLN
INTRAMUSCULAR | Status: AC
  Filled 2020-02-12: qty 1

## 2020-02-12 MED ORDER — FENTANYL CITRATE (PF) 100 MCG/2ML IJ SOLN
INTRAMUSCULAR | Status: AC
  Filled 2020-02-12: qty 2

## 2020-02-12 MED ORDER — PROPOFOL 10 MG/ML IV BOLUS
INTRAVENOUS | Status: AC
  Filled 2020-02-12: qty 20

## 2020-02-12 MED ORDER — ROCURONIUM BROMIDE 100 MG/10ML IV SOLN
INTRAVENOUS | Status: DC | PRN
  Administered 2020-02-12: 20 mg via INTRAVENOUS
  Administered 2020-02-12: 50 mg via INTRAVENOUS

## 2020-02-12 MED ORDER — ONDANSETRON HCL 4 MG/2ML IJ SOLN
INTRAMUSCULAR | Status: DC | PRN
  Administered 2020-02-12: 4 mg via INTRAVENOUS

## 2020-02-12 MED ORDER — FENTANYL CITRATE (PF) 100 MCG/2ML IJ SOLN
25.0000 ug | INTRAMUSCULAR | Status: AC | PRN
  Administered 2020-02-12 (×5): 25 ug via INTRAVENOUS

## 2020-02-12 MED ORDER — SIMETHICONE 80 MG PO CHEW
80.0000 mg | CHEWABLE_TABLET | Freq: Four times a day (QID) | ORAL | Status: DC | PRN
  Administered 2020-02-12 – 2020-02-14 (×4): 80 mg via ORAL
  Filled 2020-02-12 (×5): qty 1

## 2020-02-12 MED ORDER — ACETAMINOPHEN 10 MG/ML IV SOLN
INTRAVENOUS | Status: AC
  Filled 2020-02-12: qty 100

## 2020-02-12 MED ORDER — PROPOFOL 10 MG/ML IV BOLUS
INTRAVENOUS | Status: DC | PRN
  Administered 2020-02-12: 150 mg via INTRAVENOUS

## 2020-02-12 MED ORDER — LIDOCAINE HCL (PF) 2 % IJ SOLN
INTRAMUSCULAR | Status: AC
  Filled 2020-02-12: qty 5

## 2020-02-12 MED ORDER — DEXAMETHASONE SODIUM PHOSPHATE 10 MG/ML IJ SOLN
INTRAMUSCULAR | Status: DC | PRN
  Administered 2020-02-12: 10 mg via INTRAVENOUS

## 2020-02-12 MED ORDER — KETOROLAC TROMETHAMINE 30 MG/ML IJ SOLN: 30.0000 mg | Freq: Four times a day (QID) | INTRAMUSCULAR | Status: DC

## 2020-02-12 MED ORDER — BUPIVACAINE HCL (PF) 0.5 % IJ SOLN
INTRAMUSCULAR | Status: DC | PRN
  Administered 2020-02-12: 16 mL

## 2020-02-12 MED ORDER — ACETAMINOPHEN 10 MG/ML IV SOLN
INTRAVENOUS | Status: DC | PRN
  Administered 2020-02-12: 1000 mg via INTRAVENOUS

## 2020-02-12 MED ORDER — FENTANYL CITRATE (PF) 100 MCG/2ML IJ SOLN
INTRAMUSCULAR | Status: AC
  Administered 2020-02-12: 14:00:00 50 ug via INTRAVENOUS
  Filled 2020-02-12: qty 2

## 2020-02-12 MED ORDER — DEXAMETHASONE SODIUM PHOSPHATE 10 MG/ML IJ SOLN
INTRAMUSCULAR | Status: AC
  Filled 2020-02-12: qty 1

## 2020-02-12 MED ORDER — LACTATED RINGERS IV SOLN: INTRAVENOUS | Status: DC | PRN

## 2020-02-12 MED ORDER — LACTATED RINGERS IV SOLN: INTRAVENOUS | Status: DC

## 2020-02-12 MED ORDER — PHENYLEPHRINE HCL (PRESSORS) 10 MG/ML IV SOLN
INTRAVENOUS | Status: DC | PRN
  Administered 2020-02-12 (×2): 100 ug via INTRAVENOUS
  Administered 2020-02-12 (×2): 200 ug via INTRAVENOUS
  Administered 2020-02-12 (×2): 100 ug via INTRAVENOUS

## 2020-02-12 MED ORDER — POVIDONE-IODINE 10 % EX SWAB: 2.0000 "application " | Freq: Once | CUTANEOUS | Status: DC

## 2020-02-12 MED ORDER — ROCURONIUM BROMIDE 10 MG/ML (PF) SYRINGE
PREFILLED_SYRINGE | INTRAVENOUS | Status: AC
  Filled 2020-02-12: qty 10

## 2020-02-12 MED ORDER — SENNOSIDES-DOCUSATE SODIUM 8.6-50 MG PO TABS: 1.0000 | ORAL_TABLET | Freq: Every evening | ORAL | Status: DC | PRN

## 2020-02-12 MED ORDER — FENTANYL CITRATE (PF) 100 MCG/2ML IJ SOLN
INTRAMUSCULAR | Status: AC
  Administered 2020-02-12: 14:00:00 25 ug via INTRAVENOUS
  Filled 2020-02-12: qty 2

## 2020-02-12 MED ORDER — PROMETHAZINE HCL 25 MG/ML IJ SOLN: 6.2500 mg | INTRAMUSCULAR | Status: DC | PRN

## 2020-02-12 MED ORDER — KETOROLAC TROMETHAMINE 30 MG/ML IJ SOLN
INTRAMUSCULAR | Status: AC
  Administered 2020-02-12: 14:00:00 30 mg via INTRAVENOUS
  Filled 2020-02-12: qty 1

## 2020-02-12 MED ORDER — LIDOCAINE HCL (CARDIAC) PF 100 MG/5ML IV SOSY
PREFILLED_SYRINGE | INTRAVENOUS | Status: DC | PRN
  Administered 2020-02-12: 100 mg via INTRAVENOUS

## 2020-02-12 MED ORDER — ONDANSETRON HCL 4 MG/2ML IJ SOLN
INTRAMUSCULAR | Status: AC
  Filled 2020-02-12: qty 2

## 2020-02-12 MED ORDER — FENTANYL CITRATE (PF) 100 MCG/2ML IJ SOLN: 50.0000 ug | Freq: Once | INTRAMUSCULAR | Status: AC

## 2020-02-12 MED ORDER — FENTANYL CITRATE (PF) 100 MCG/2ML IJ SOLN
INTRAMUSCULAR | Status: DC | PRN
  Administered 2020-02-12: 50 ug via INTRAVENOUS
  Administered 2020-02-12 (×2): 25 ug via INTRAVENOUS

## 2020-02-12 MED ORDER — MIDAZOLAM HCL 2 MG/2ML IJ SOLN
INTRAMUSCULAR | Status: AC
  Filled 2020-02-12: qty 2

## 2020-02-12 MED ORDER — MIDAZOLAM HCL 2 MG/2ML IJ SOLN
INTRAMUSCULAR | Status: DC | PRN
  Administered 2020-02-12: 2 mg via INTRAVENOUS

## 2020-02-12 MED ORDER — GLYCOPYRROLATE 0.2 MG/ML IJ SOLN
INTRAMUSCULAR | Status: DC | PRN
  Administered 2020-02-12: .2 mg via INTRAVENOUS

## 2020-02-12 MED ORDER — BUPIVACAINE HCL (PF) 0.5 % IJ SOLN
INTRAMUSCULAR | Status: AC
  Filled 2020-02-12: qty 30

## 2020-02-12 SURGICAL SUPPLY — 48 items
ADH SKN CLS APL DERMABOND .7 (GAUZE/BANDAGES/DRESSINGS) ×1
APL PRP STRL LF DISP 70% ISPRP (MISCELLANEOUS) ×1
BAG SPEC RTRVL LRG 6X4 10 (ENDOMECHANICALS)
BLADE SURG SZ11 CARB STEEL (BLADE) ×3 IMPLANT
CANISTER SUCT 1200ML W/VALVE (MISCELLANEOUS) ×1 IMPLANT
CATH ROBINSON RED A/P 16FR (CATHETERS) ×3 IMPLANT
CHLORAPREP W/TINT 26 (MISCELLANEOUS) ×3 IMPLANT
COVER WAND RF STERILE (DRAPES) ×3 IMPLANT
DERMABOND ADVANCED (GAUZE/BANDAGES/DRESSINGS) ×2
DERMABOND ADVANCED .7 DNX12 (GAUZE/BANDAGES/DRESSINGS) ×1 IMPLANT
DRSG TELFA 4X3 1S NADH ST (GAUZE/BANDAGES/DRESSINGS) IMPLANT
GAUZE 4X4 16PLY RFD (DISPOSABLE) ×3 IMPLANT
GLOVE BIO SURGEON STRL SZ8 (GLOVE) ×3 IMPLANT
GLOVE INDICATOR 8.0 STRL GRN (GLOVE) ×3 IMPLANT
GOWN STRL REUS W/ TWL LRG LVL3 (GOWN DISPOSABLE) ×1 IMPLANT
GOWN STRL REUS W/ TWL XL LVL3 (GOWN DISPOSABLE) ×1 IMPLANT
GOWN STRL REUS W/TWL LRG LVL3 (GOWN DISPOSABLE) ×3
GOWN STRL REUS W/TWL XL LVL3 (GOWN DISPOSABLE) ×3
IRRIGATION STRYKERFLOW (MISCELLANEOUS) IMPLANT
IRRIGATOR STRYKERFLOW (MISCELLANEOUS) ×3
IV LACTATED RINGERS 1000ML (IV SOLUTION) ×2 IMPLANT
KIT PINK PAD W/HEAD ARE REST (MISCELLANEOUS) ×3
KIT PINK PAD W/HEAD ARM REST (MISCELLANEOUS) ×1 IMPLANT
LABEL OR SOLS (LABEL) ×1 IMPLANT
MANIFOLD NEPTUNE II (INSTRUMENTS) ×3 IMPLANT
NEEDLE VERESS 14GA 120MM (NEEDLE) ×3 IMPLANT
NS IRRIG 500ML POUR BTL (IV SOLUTION) ×5 IMPLANT
PACK GYN LAPAROSCOPIC (MISCELLANEOUS) ×3 IMPLANT
PAD PREP 24X41 OB/GYN DISP (PERSONAL CARE ITEMS) ×3 IMPLANT
POUCH SPECIMEN RETRIEVAL 10MM (ENDOMECHANICALS) IMPLANT
SCISSORS METZENBAUM CVD 33 (INSTRUMENTS) ×3 IMPLANT
SET TUBE SMOKE EVAC HIGH FLOW (TUBING) ×3 IMPLANT
SHEARS HARMONIC ACE PLUS 36CM (ENDOMECHANICALS) ×2 IMPLANT
SLEEVE ENDOPATH XCEL 5M (ENDOMECHANICALS) IMPLANT
SOL ANTI-FOG 6CC FOG-OUT (MISCELLANEOUS) IMPLANT
SOL FOG-OUT ANTI-FOG 6CC (MISCELLANEOUS) ×2
SOL PREP PROV IODINE SCRUB 4OZ (MISCELLANEOUS) ×1 IMPLANT
SPONGE GAUZE 2X2 8PLY STER LF (GAUZE/BANDAGES/DRESSINGS)
SPONGE GAUZE 2X2 8PLY STRL LF (GAUZE/BANDAGES/DRESSINGS) IMPLANT
STRAP SAFETY 5IN WIDE (MISCELLANEOUS) ×3 IMPLANT
SUT VIC AB 2-0 UR6 27 (SUTURE) IMPLANT
SUT VIC AB 4-0 FS2 27 (SUTURE) ×2 IMPLANT
SUT VIC AB 4-0 PS2 18 (SUTURE) IMPLANT
SWAB CULTURE AMIES ANAERIB BLU (MISCELLANEOUS) ×4 IMPLANT
SYR 10ML LL (SYRINGE) ×3 IMPLANT
SYR 30ML LL (SYRINGE) ×2 IMPLANT
TROCAR ENDO BLADELESS 11MM (ENDOMECHANICALS) IMPLANT
TROCAR XCEL NON-BLD 5MMX100MML (ENDOMECHANICALS) ×3 IMPLANT

## 2020-02-12 NOTE — Anesthesia Procedure Notes (Signed)

## 2020-02-12 NOTE — Discharge Instructions (Signed)
Diagnostic Laparoscopy, Care After This sheet gives you information about how to care for yourself after your procedure. Your health care provider may also give you more specific instructions. If you have problems or questions, contact your health care provider. What can I expect after the procedure? After the procedure, it is common to have:  Mild discomfort in the abdomen.  Sore throat. Women who have laparoscopy with pelvic examination may have mild cramping and fluid coming from the vagina for a few days after the procedure. Follow these instructions at home: Medicines  Take over-the-counter and prescription medicines only as told by your health care provider.  If you were prescribed an antibiotic medicine, take it as told by your health care provider. Do not stop taking the antibiotic even if you start to feel better. Driving  Do not drive for 24 hours if you were given a medicine to help you relax (sedative) during your procedure.  Do not drive or use heavy machinery while taking prescription pain medicine. Bathing  Do not take baths, swim, or use a hot tub until your health care provider approves. You may take showers. Incision care   Follow instructions from your health care provider about how to take care of your incisions. Make sure you: ? Wash your hands with soap and water before you change your bandage (dressing). If soap and water are not available, use hand sanitizer. ? Change your dressing as told by your health care provider. ? Leave stitches (sutures), skin glue, or adhesive strips in place. These skin closures may need to stay in place for 2 weeks or longer. If adhesive strip edges start to loosen and curl up, you may trim the loose edges. Do not remove adhesive strips completely unless your health care provider tells you to do that.  Check your incision areas every day for signs of infection. Check for: ? Redness, swelling, or pain. ? Fluid or  blood. ? Warmth. ? Pus or a bad smell. Activity  Return to your normal activities as told by your health care provider. Ask your health care provider what activities are safe for you.  Do not lift anything that is heavier than 10 lb (4.5 kg), or the limit that you are told, until your health care provider says that it is safe. General instructions  To prevent or treat constipation while you are taking prescription pain medicine, your health care provider may recommend that you: ? Drink enough fluid to keep your urine pale yellow. ? Take over-the-counter or prescription medicines. ? Eat foods that are high in fiber, such as fresh fruits and vegetables, whole grains, and beans. ? Limit foods that are high in fat and processed sugars, such as fried and sweet foods.  Do not use any products that contain nicotine or tobacco, such as cigarettes and e-cigarettes. If you need help quitting, ask your health care provider.  Keep all follow-up visits as told by your health care provider. This is important. Contact a health care provider if:  You develop shoulder pain.  You feel lightheaded or faint.  You are unable to pass gas or have a bowel movement.  You feel nauseous or you vomit.  You develop a rash.  You have redness, swelling, or pain around any incision.  You have fluid or blood coming from any incision.  Any incision feels warm to the touch.  You have pus or a bad smell coming from any incision.  You have a fever or chills. Get help   right away if:  You have severe pain.  You have vomiting that does not go away.  You have heavy bleeding from the vagina.  Any incision opens.  You have trouble breathing.  You have chest pain. Summary  After the procedure, it is common to have mild discomfort in the abdomen and a sore throat.  Check your incision areas every day for signs of infection.  Return to your normal activities as told by your health care provider. Ask  your health care provider what activities are safe for you. This information is not intended to replace advice given to you by your health care provider. Make sure you discuss any questions you have with your health care provider. Document Revised: 01/23/2017 Document Reviewed: 08/06/2016 Elsevier Patient Education  2020 Elsevier Inc.  

## 2020-02-12 NOTE — Progress Notes (Signed)
Pt 02 sats at 87% on room air, 90% on 2L. Pt complaining of sharp chest pain and is not able to take a good deep breath. 02 increased to 3L with 02 sat at 94%. Dr. Tiburcio Pea paged. Orders received to give pt incentive spirometer and instruct on use and encourage ways to get patient to pas gas. Will continue to monitor.

## 2020-02-12 NOTE — Anesthesia Postprocedure Evaluation (Signed)
Anesthesia Post Note  Patient: [name] Green  Procedure(s) Performed: LAPAROSCOPY OPERATIVE, DRAINAGE OF RIGHT TUBOVARIAN ABCESS (Right Abdomen)  Patient location during evaluation: PACU Anesthesia Type: General Level of consciousness: awake and alert Pain management: pain level controlled Vital Signs Assessment: post-procedure vital signs reviewed and stable Respiratory status: spontaneous breathing, nonlabored ventilation, respiratory function stable and patient connected to nasal cannula oxygen Cardiovascular status: blood pressure returned to baseline and stable Postop Assessment: no apparent nausea or vomiting Anesthetic complications: no   No complications documented.   Last Vitals:  Vitals:   02/12/20 1805 02/12/20 1925  BP: (!) 95/58 109/63  Pulse: 85 85  Resp: 14 16  Temp: 36.9 C 37.6 C  SpO2: 96% 99%    Last Pain:  Vitals:   02/12/20 1925  TempSrc: Oral  PainSc:                  Lenard Simmer

## 2020-02-12 NOTE — Op Note (Signed)
  Operative Note   02/12/2020  PRE-OP DIAGNOSIS: Right TOA, Pelvic pain    POST-OP DIAGNOSIS: same   PROCEDURE: Procedure(s): LAPAROSCOPY OPERATIVE, DRAINAGE OF RIGHT TUBOVARIAN ABCESS   SURGEON: Annamarie Major, MD, FACOG  ANESTHESIA: Choice   ESTIMATED BLOOD LOSS: 50 mL  COMPLICATIONS: None  DISPOSITION: PACU - hemodynamically stable.  CONDITION: stable  FINDINGS: Laparoscopic survey of the abdomen revealed a right sided pyosalpinx with adhesions surrounding adnexa associated with this etiology.   Left tube and ovary normal.  Liver normal.  Appendix adherent near adnexa but not inflammed.  Uterus normal.  PROCEDURE IN DETAIL: The patient was taken to the OR where anesthesia was administed. The patient was positioned in dorsal lithotomy in the Huey stirrups. The patient was then examined under anesthesia with the above noted findings. The patient was prepped and draped in the normal sterile fashion and foley catheter was placed. A Graves speculum was placed in the vagina and the anterior lip of the cervix was grasped with a single toothed tenaculum. A Hulka uterine manipulator was then inserted in the uterus. Uterine mobility was found to be satisfactory. The speculum was then removed.  Attention was turned to the patient's abdomen where a 5 mm skin incision was made in the umbilical fold, after injection of local anesthesia. The Veress step needle was carefully introduced into the peritoneal cavity with placement confirmed using the hanging drop technique.  Pneumoperitoneum was obtained. The 5 mm port was then placed under direct visualization with the operative laparoscope.  Trendelenburg positioning.  Additional 5 mm trocar was then placed in the RLQ lateral to the inferior epigastric blood vessels and suprapubic region under direct visualization with the laparoscope.  Instrumentation to visualize complete pelvic anatomy performed.    The right sided adnexal abscess has two cultures  obtained.   The mass in mobilized with blunt dissection with no colonic or small intestine involvement.  Drainage of some purulent fluid is performed.  Did not remove tube based on age/nulliparity status and likelihood that drainage plus IV antibiotics can cure her of this process.  Irrigation and then aspiration of fluid performed.    Instruments and trocars removed, gas expelled, and skin closed with skin adhesive glue.  Instrument, needle, and sponge counts correct x2 at the conclusion of the case.  Pt goes to recovery room in stable condition.  Annamarie Major, MD, [facility], [facility] 02/12/2020  1:26 PM

## 2020-02-12 NOTE — Progress Notes (Signed)
Benign Gynecology Progress Note  Admission Date: 02/10/2020 Current Date: 02/12/2020  [name] Green is a 19 y.o.  HD#2    TuboOvarian Abscess   ROS and patient/family/surgical history, located on admission H&P note dated 02/10/2020, have been reviewed, and there are no changes except as noted below  Yesterday/Overnight Events:  Fever spike at 2200 to 101.8 Pain still significant and severe per patient  Subjective:  Pt reports pain especially in lower quadrants of abdomen and pelvis, worse w movement, min relief w Oxycontin or Morphine. She has been her 48 hours on IV ABX  Objective:   Temp:  [97.6 F (36.4 C)-101.8 F (38.8 C)] 97.6 F (36.4 C) (12/19 0803) Pulse Rate:  [84-116] 84 (12/19 0803) Resp:  [16-20] 18 (12/19 0803) BP: (98-129)/(47-62) 103/51 (12/19 0803) SpO2:  [94 %-100 %] 94 % (12/19 0803) I/O last 3 completed shifts: In: 2721.8 [P.O.:240; I.V.:2233.6; IV Piggyback:248.2] Out: 7650 [Urine:7650] No intake/output data recorded.  Physical exam: General appearance: alert, cooperative and appears stated age Abdomen: soft, ND, mod-sev T on exam in RLQ, LLQ; rebound and guarding present GU: No gross VB Lungs: clear to auscultation bilaterally Heart: regular rate and rhythm and no MRGs Extremities: no redness or tenderness in the calves or thighs, no edema Skin: no lesions Psych: appropriate  Recent Labs  Lab 02/09/20 1918  NA 133*  K 3.8  CL 100  CO2 22  BUN 8  CREATININE 0.82  GLUCOSE 116*   Recent Labs  Lab 02/09/20 1918 02/12/20 0536  WBC 9.7 8.4  HGB 11.5* 10.2*  HCT 35.3* 31.1*  PLT 279 218    Recent Labs  Lab 02/09/20 1918  CALCIUM 9.0   No results for input(s): INR, APTT in the last 168 hours.    Recent Labs  Lab 02/09/20 1918  ALKPHOS 66  BILITOT 0.7  PROT 7.7  ALT 22  AST 17    Recent Labs  Lab 02/09/20 1918 02/12/20 0536  WBC 9.7 8.4  HGB 11.5* 10.2*  HCT 35.3* 31.1*  PLT 279 218   Recent Labs  Lab 02/09/20 1918   NA 133*  K 3.8  CL 100  CO2 22  BUN 8  CREATININE 0.82  CALCIUM 9.0  PROT 7.7  BILITOT 0.7  ALKPHOS 66  ALT 22  AST 17  GLUCOSE 116*    Radiology See [place] and CT results from 12/17  Assessment & Plan:  19 yo G0 WF with TOA and persistent pain and fever related to this  Problem List Items Addressed This Visit      Endocrine   TOA (tubo-ovarian abscess) - Primary   Relevant Orders   IR Radiologist Eval & Mgmt - discussed w Radiologist who reviewed the films    Believes drainage procedure has low degree of success based on location and loculation characteristics of abscess  Due to pain and fever persisting after 48 hours of initial ABX treatment, offered option of laparoscopic drainage.  Pros and cons of surgery discussed, risks and recovery counseled, fertility concerns as well based on tubal damage or removal.  Consent obtained, and pt agrees that this option may be best option to expedite clearance of abscess and thus pain and fever.    The risks of surgery discussed with the patient included but were not limited to: bleeding which may require transfusion or reoperation; infection which may require antibiotics; injury to bowel, bladder, ureters or other surrounding organs; need for additional procedures including hysterectomy in the event of  a life-threatening hemorrhage; incisional problems, thromboembolic phenomenon and other postoperative/anesthesia complications. The patient concurred with the proposed plan, giving informed written consent for the procedure.   Will continue ABX after surgery and convert to po ABX as able, with then outpatient mgt    Other Visit Diagnoses    PID (acute pelvic inflammatory disease)        Annamarie Major, MD, [facility], [facility] 02/12/2020  9:29 AM

## 2020-02-12 NOTE — Anesthesia Preprocedure Evaluation (Signed)
Anesthesia Evaluation  Patient identified by MRN, date of birth, ID band Patient awake    Reviewed: Allergy & Precautions, H&P , NPO status , Patient's Chart, lab work & pertinent test results, reviewed documented beta blocker date and time   History of Anesthesia Complications Negative for: history of anesthetic complications  Airway Mallampati: III  TM Distance: >3 FB Neck ROM: full    Dental  (+) Dental Advidsory Given, Teeth Intact, Chipped   Pulmonary neg shortness of breath, asthma , Recent URI , Resolved, Current Smoker,    Pulmonary exam normal breath sounds clear to auscultation       Cardiovascular Exercise Tolerance: Good negative cardio ROS Normal cardiovascular exam Rhythm:regular Rate:Normal     Neuro/Psych PSYCHIATRIC DISORDERS Anxiety negative neurological ROS     GI/Hepatic Neg liver ROS, GERD  ,  Endo/Other  negative endocrine ROS  Renal/GU negative Renal ROS  negative genitourinary   Musculoskeletal   Abdominal   Peds  Hematology negative hematology ROS (+)   Anesthesia Other Findings Past Medical History: No date: ADHD (attention deficit hyperactivity disorder) 01/01/2018: Anxiety state No date: Asthma No date: Bloody diarrhea     Comment:  With mucous No date: Headache(784.0)     Comment:  Tues and Fever-100.6 No date: Meningitis     Comment:  as an inafant No date: Varicella   Reproductive/Obstetrics negative OB ROS                             Anesthesia Physical Anesthesia Plan  ASA: II  Anesthesia Plan: General   Post-op Pain Management:    Induction: Intravenous  PONV Risk Score and Plan: 3 and Dexamethasone, Ondansetron, Midazolam, Promethazine and Treatment may vary due to age or medical condition  Airway Management Planned: Oral ETT  Additional Equipment:   Intra-op Plan:   Post-operative Plan: Extubation in OR  Informed Consent: I have  reviewed the patients History and Physical, chart, labs and discussed the procedure including the risks, benefits and alternatives for the proposed anesthesia with the patient or authorized representative who has indicated his/her understanding and acceptance.     Dental Advisory Given  Plan Discussed with: Anesthesiologist, CRNA and Surgeon  Anesthesia Plan Comments:         Anesthesia Quick Evaluation

## 2020-02-12 NOTE — Transfer of Care (Signed)
Immediate Anesthesia Transfer of Care Note  Patient: [name] Green  Procedure(s) Performed: Procedure(s): LAPAROSCOPY OPERATIVE, DRAINAGE OF RIGHT TUBOVARIAN ABCESS (Right)  Patient Location: PACU  Anesthesia Type:General  Level of Consciousness: sedated  Airway & Oxygen Therapy: Patient Spontanous Breathing and Patient connected to face mask oxygen  Post-op Assessment: Report given to RN and Post -op Vital signs reviewed and stable  Post vital signs: Reviewed and stable  Last Vitals:  Vitals:   02/12/20 0803 02/12/20 1331  BP: (!) 103/51 (!) 79/38  Pulse: 84 76  Resp: 18 17  Temp: 36.4 C 37.2 C  SpO2: 94% 100%    Complications: No apparent anesthesia complications

## 2020-02-13 ENCOUNTER — Encounter: Payer: Self-pay | Admitting: Obstetrics & Gynecology

## 2020-02-13 DIAGNOSIS — N730 Acute parametritis and pelvic cellulitis: Secondary | ICD-10-CM

## 2020-02-13 DIAGNOSIS — N7093 Salpingitis and oophoritis, unspecified: Principal | ICD-10-CM

## 2020-02-13 LAB — CBC
HCT: 29.5 % — ABNORMAL LOW (ref 36.0–46.0)
Hemoglobin: 9.6 g/dL — ABNORMAL LOW (ref 12.0–15.0)
MCH: 27 pg (ref 26.0–34.0)
MCHC: 32.5 g/dL (ref 30.0–36.0)
MCV: 82.9 fL (ref 80.0–100.0)
Platelets: 264 10*3/uL (ref 150–400)
RBC: 3.56 MIL/uL — ABNORMAL LOW (ref 3.87–5.11)
RDW: 13.2 % (ref 11.5–15.5)
WBC: 22.1 10*3/uL — ABNORMAL HIGH (ref 4.0–10.5)
nRBC: 0 % (ref 0.0–0.2)

## 2020-02-13 NOTE — Progress Notes (Signed)
Subjective:   Patient reports she did not sleep much overnight. She is burping and passing flatus. She feels a lot of air in her abdomen. She was able to shower and wash her hair. She is using the incentive spirometer. She has not had fevers overnight.   Objective:  Blood pressure (!) 105/56, pulse 88, temperature 98.3 F (36.8 C), temperature source Oral, resp. rate 18, height 5\' 2"  (1.575 m), weight 90.7 kg, SpO2 95 %.  General: NAD Pulmonary: no increased work of breathing Abdomen: non-distended, non-tender, fundus firm at level of umbilicus Incision: clean, dry, intact Extremities: no edema, no erythema, no tenderness  Results for orders placed or performed during the hospital encounter of 02/10/20 (from the past 72 hour(s))  CBC     Status: Abnormal   Collection Time: 02/12/20  5:36 AM  Result Value Ref Range   WBC 8.4 4.0 - 10.5 K/uL   RBC 3.71 (L) 3.87 - 5.11 MIL/uL   Hemoglobin 10.2 (L) 12.0 - 15.0 g/dL   HCT 02/14/20 (L) 83.4 - 19.6 %   MCV 83.8 80.0 - 100.0 fL   MCH 27.5 26.0 - 34.0 pg   MCHC 32.8 30.0 - 36.0 g/dL   RDW 22.2 97.9 - 89.2 %   Platelets 218 150 - 400 K/uL   nRBC 0.0 0.0 - 0.2 %    Comment: Performed at [facility], [address] [place], [place]  Aerobic/Anaerobic Culture (surgical/deep wound)     Status: None (Preliminary result)   Collection Time: 02/12/20 12:35 PM   Specimen: PATH Cytology Misc. fluid; Body Fluid  Result Value Ref Range   Specimen Description      ABSCESS RIGHT TUBAL OVARIAN ABSCESS FLUID Performed at [facility], [address], [place], [place]    Special Requests      NONE Performed at [facility], [address] [place], [place]    Gram Stain      NO WBC SEEN NO ORGANISMS SEEN Performed at [facility], [address], [place], [place]    Culture PENDING    Report Status PENDING   Aerobic/Anaerobic Culture (surgical/deep wound)     Status:  None (Preliminary result)   Collection Time: 02/12/20 12:43 PM   Specimen: Abscess  Result Value Ref Range   Specimen Description      ABSCESS PELVIC FLUID Performed at [facility], [address], [place], [place]    Special Requests      NONE Performed at [facility], [address] [place], [place]    Gram Stain      NO WBC SEEN NO ORGANISMS SEEN Performed at [facility], [address], [place], [place]    Culture PENDING    Report Status PENDING   CBC     Status: Abnormal   Collection Time: 02/13/20  3:03 AM  Result Value Ref Range   WBC 22.1 (H) 4.0 - 10.5 K/uL   RBC 3.56 (L) 3.87 - 5.11 MIL/uL   Hemoglobin 9.6 (L) 12.0 - 15.0 g/dL   HCT 02/15/20 (L) 28.7 - 86.7 %   MCV 82.9 80.0 - 100.0 fL   MCH 27.0 26.0 - 34.0 pg   MCHC 32.5 30.0 - 36.0 g/dL   RDW 67.2 09.4 - 70.9 %   Platelets 264 150 - 400 K/uL   nRBC 0.0 0.0 - 0.2 %    Comment: Performed at  [facility], [address] [place], [place] [address]     Assessment:   19 y.o. G0P0 with TOA s/p laparoscopy and drainaged. Postoperativeday # 1   Plan:  1)TOA- continue with IV antibiotics, unasyn and doxycycline  2) Regular diet  3) Pain controled with PO and IV antibiotics  4) Encouraged ambulation  5) Plan for discharge tomorrow if leukocytosis is improving.   Adelene Idler MD, [facility], [facility] 02/13/2020 8:46 AM

## 2020-02-14 LAB — CBC
HCT: 26.6 % — ABNORMAL LOW (ref 36.0–46.0)
Hemoglobin: 8.4 g/dL — ABNORMAL LOW (ref 12.0–15.0)
MCH: 26.5 pg (ref 26.0–34.0)
MCHC: 31.6 g/dL (ref 30.0–36.0)
MCV: 83.9 fL (ref 80.0–100.0)
Platelets: 280 10*3/uL (ref 150–400)
RBC: 3.17 MIL/uL — ABNORMAL LOW (ref 3.87–5.11)
RDW: 13.4 % (ref 11.5–15.5)
WBC: 13.5 10*3/uL — ABNORMAL HIGH (ref 4.0–10.5)
nRBC: 0 % (ref 0.0–0.2)

## 2020-02-14 MED ORDER — HYDROXYZINE HCL 25 MG PO TABS: 25.0000 mg | ORAL_TABLET | Freq: Four times a day (QID) | ORAL | Status: DC | PRN

## 2020-02-14 MED ORDER — IBUPROFEN 600 MG PO TABS: 600.0000 mg | ORAL_TABLET | Freq: Four times a day (QID) | ORAL | 3 refills | Status: DC | PRN

## 2020-02-14 MED ORDER — OXYCODONE-ACETAMINOPHEN 5-325 MG PO TABS: 1.0000 | ORAL_TABLET | ORAL | 0 refills | Status: DC | PRN

## 2020-02-14 MED ORDER — HYDROXYZINE HCL 25 MG PO TABS: 25.0000 mg | ORAL_TABLET | Freq: Four times a day (QID) | ORAL | 0 refills | Status: DC | PRN

## 2020-02-14 MED ORDER — SERTRALINE HCL 50 MG PO TABS: 50.0000 mg | ORAL_TABLET | Freq: Every day | ORAL | 2 refills | Status: DC

## 2020-02-14 MED ORDER — OXYCODONE-ACETAMINOPHEN 5-325 MG PO TABS: 1.0000 | ORAL_TABLET | Freq: Four times a day (QID) | ORAL | Status: DC | PRN

## 2020-02-14 MED ORDER — DOXYCYCLINE HYCLATE 100 MG PO TABS: 100.0000 mg | ORAL_TABLET | Freq: Two times a day (BID) | ORAL | 0 refills | Status: AC

## 2020-02-14 NOTE — Progress Notes (Signed)
Pt discharged home. Discharge instructions, prescriptions, and follow up appointments given to and reviewed with pt. Pt verbalized understanding. To be escorted by axillary.  

## 2020-02-14 NOTE — Discharge Summary (Signed)
Physician Discharge Summary  Patient ID: [name] Green MRN: [id number] DOB/AGE: [birth year] 19 y.o.  Admit date: 02/10/2020 Discharge date: 02/14/2020  Admission Diagnoses: PID, TPA  Discharge Diagnoses:  Active Problems:   TOA (tubo-ovarian abscess)   PID (acute pelvic inflammatory disease)   Discharged Condition: good  Hospital Course: 19 y.o. female admitted for culture negative tubo-ovarian abscess and PID on 12/17/20201.  She completed a 48-hr course of IV antibiotics with continued abdominal pain and and fevers.  Interventional radiology deemed to collection to not be amenable to percutanous drainge so the patient was taken to the OR on 02/12/2020 for laparoscopic wash out.  Following this the patient completed another 48-hrs of IV antibiotics.  At the time of discharge the patient had been afebrile >24-hrs, had a decreasing WBC, and culture obtained intraoperative remained negative.  The patient reported imporved pain control following the procedure.    The patient has a history of anxiety.  She was previously on Zoloft and interested in restarting.  She was restarted zoloft for past history anxiety prn vistaril  Consults: None  Significant Diagnostic Studies:  Results for orders placed or performed during the hospital encounter of 02/10/20 (from the past 72 hour(s))  CBC     Status: Abnormal   Collection Time: 02/12/20  5:36 AM  Result Value Ref Range   WBC 8.4 4.0 - 10.5 K/uL   RBC 3.71 (L) 3.87 - 5.11 MIL/uL   Hemoglobin 10.2 (L) 12.0 - 15.0 g/dL   HCT 01.7 (L) 79.3 - 90.3 %   MCV 83.8 80.0 - 100.0 fL   MCH 27.5 26.0 - 34.0 pg   MCHC 32.8 30.0 - 36.0 g/dL   RDW 00.9 23.3 - 00.7 %   Platelets 218 150 - 400 K/uL   nRBC 0.0 0.0 - 0.2 %    Comment: Performed at [facility], [address] [place], [place] [address]  Aerobic/Anaerobic Culture (surgical/deep wound)     Status: None (Preliminary result)   Collection Time: 02/12/20 12:35 PM   Specimen: PATH  Cytology Misc. fluid; Body Fluid  Result Value Ref Range   Specimen Description      ABSCESS RIGHT TUBAL OVARIAN ABSCESS FLUID Performed at [facility], [address] [place], [place] [address]    Special Requests      NONE Performed at [facility], [address] [place], [place] [address]    Gram Stain NO WBC SEEN NO ORGANISMS SEEN     Culture      NO GROWTH 2 DAYS Performed at [facility], [address], [place], [place] [address]    Report Status PENDING   Aerobic/Anaerobic Culture (surgical/deep wound)     Status: None (Preliminary result)   Collection Time: 02/12/20 12:43 PM   Specimen: Abscess  Result Value Ref Range   Specimen Description      ABSCESS PELVIC FLUID Performed at [facility], [address] [place], [place] [address]    Special Requests      NONE Performed at [facility], [address] [place], [place] [address]    Gram Stain NO WBC SEEN NO ORGANISMS SEEN     Culture      NO GROWTH 2 DAYS Performed at [facility], [address] [place], [place] [address]    Report Status PENDING   CBC     Status: Abnormal   Collection Time: 02/13/20  3:03 AM  Result Value Ref Range  WBC 22.1 (H) 4.0 - 10.5 K/uL   RBC 3.56 (L) 3.87 - 5.11 MIL/uL   Hemoglobin 9.6 (L) 12.0 - 15.0 g/dL   HCT 37.9 (L) 02.4 - 09.7 %   MCV 82.9 80.0 - 100.0 fL   MCH 27.0 26.0 - 34.0 pg   MCHC 32.5 30.0 - 36.0 g/dL   RDW 35.3 29.9 - 24.2 %   Platelets 264 150 - 400 K/uL   nRBC 0.0 0.0 - 0.2 %    Comment: Performed at [facility], [address] [place], [place] [address]  CBC     Status: Abnormal   Collection Time: 02/14/20  6:04 AM  Result Value Ref Range   WBC 13.5 (H) 4.0 - 10.5 K/uL   RBC 3.17 (L) 3.87 - 5.11 MIL/uL   Hemoglobin 8.4 (L) 12.0 - 15.0 g/dL   HCT 96.2 (L) 22.9 - 79.8 %   MCV 83.9 80.0 - 100.0 fL   MCH 26.5 26.0 - 34.0 pg   MCHC 31.6 30.0 - 36.0 g/dL   RDW 92.1 19.4 - 17.4 %    Platelets 280 150 - 400 K/uL   nRBC 0.0 0.0 - 0.2 %    Comment: Performed at [facility], [address] [place], [place] [address]   CT Abdomen Pelvis W Contrast  Result Date: 02/10/2020 CLINICAL DATA:  Abdominal pain, fever, nausea, constipation EXAM: CT ABDOMEN AND PELVIS WITH CONTRAST TECHNIQUE: Multidetector CT imaging of the abdomen and pelvis was performed using the standard protocol following bolus administration of intravenous contrast. CONTRAST:  43mL OMNIPAQUE IOHEXOL 350 MG/ML SOLN COMPARISON:  None. FINDINGS: Lower chest: No acute abnormality. Hepatobiliary: No focal liver abnormality is seen. No gallstones, gallbladder wall thickening, or biliary dilatation. Pancreas: Unremarkable Spleen: Unremarkable Adrenals/Urinary Tract: Adrenal glands are unremarkable. Kidneys are normal, without renal calculi, focal lesion, or hydronephrosis. Bladder is unremarkable. Stomach/Bowel: The stomach, small bowel, and large bowel are unremarkable save for moderate stool within the colon. The appendix is well visualized separate from the inflammatory process within the adjacent pelvis, best seen on sagittal image # 54. There is no free intraperitoneal gas or fluid. Vascular/Lymphatic: The abdominal vasculature is unremarkable. There is shotty right external iliac and aortocaval adenopathy, likely reactive given the inflammatory process within the right adnexa. Reproductive: The right fallopian flu is dilated, thick-walled, and demonstrates mild peritubal inflammatory stranding in keeping with a probable pyosalpinx/tubo-ovarian abscess. Small internal septa within the dilated fallopian tube or best appreciated on sagittal image # 62. The ovary is best appreciated on axial image # 62 and coronal image # 47 adjacent to the dilated, inflamed fallopian tube. The developing abscess measures roughly 4.7 x 5.2 by 4.6 cm in greatest dimension. Intrauterine device noted within the endometrial cavity. Left adnexa  is unremarkable. Other: Rectum unremarkable.  Tiny fat containing umbilical hernia. Musculoskeletal: No acute bone abnormality. IMPRESSION: Dilated, fluid-filled, thick walled right fallopian tube in keeping with a pyosalpinx/tubo-ovarian abscess. This measures 4.7 x 5.2 x 4.6 cm in greatest dimension and appears separate from the adjacent ovary. Normal appendix Electronically Signed   By: Helyn Numbers MD   On: 02/10/2020 02:56   US PELVIC COMPLETE WITH TRANSVAGINAL  Result Date: 02/10/2020 CLINICAL DATA:  Tubo-ovarian abscess seen on CT scan. EXAM: TRANSABDOMINAL AND TRANSVAGINAL ULTRASOUND OF PELVIS TECHNIQUE: Both transabdominal and transvaginal ultrasound examinations of the pelvis were performed. Transabdominal technique was performed for global imaging of the pelvis including uterus, ovaries, adnexal regions, and pelvic cul-de-sac. It was necessary  to proceed with endovaginal exam following the transabdominal exam to visualize the endometrium and ovaries. COMPARISON:  CT of same day. FINDINGS: Uterus Measurements: 8.8 x 5.2 x 3.8 cm = volume: 90 mL. No fibroids or other mass visualized. Endometrium Thickness: 6 mm which is within normal limits. Intrauterine device is noted within endometrial canal. Right ovary Measurements: 4.4 x 4.0 x 3.9 cm = volume: 39 mL. 3.6 cm simple cyst is seen in right ovary most consistent with follicular cyst. Left ovary Measurements: 3.1 x 2.4 x 2.0 cm = volume: 5 mL. Normal appearance/no adnexal mass. Other findings No abnormal free fluid. 4.4 x 4.2 x 4.3 cm complex but predominantly hypoechoic lobular abnormality is seen in the right adnexal region concerning for tubo-ovarian abscess as noted on prior CT scan. IMPRESSION: 4.4 x 4.3 x 4.2 cm complex but predominantly hypoechoic lobular abnormality is seen in right adnexal region concerning for tubo-ovarian abscess as described on prior CT scan. 3.6 cm simple cyst is seen in right ovary most consistent with follicular cyst.  No follow up imaging recommended. Note: This recommendation does not apply to premenarchal patients or to those with increased risk (genetic, family history, elevated tumor markers or other high-risk factors) of ovarian cancer. Reference: Radiology 2019 Nov; 293(2):359-371. Intrauterine device noted within endometrial canal. Electronically Signed   By: Lupita RaiderJames  Green Jr M.D.   On: 02/10/2020 09:05    Treatments: antibiotics: unasyn and doxycline and surgery: laparoscopy  Discharge Exam: Blood pressure 119/62, pulse 72, temperature 97.6 F (36.4 C), temperature source Oral, resp. rate 18, height 5\' 2"  (1.575 m), weight 90.7 kg, SpO2 99 %. General appearance: alert, appears stated age and no distress GI: soft, non-tender; bowel sounds normal; no masses,  no organomegaly Extremities: extremities normal, atraumatic, no cyanosis or edema Incision/Wound:D/C/I trocar sites  Disposition: Discharge disposition: 01-Home or Self Care       Discharge Instructions    Activity as tolerated   Complete by: As directed    Call MD for:   Complete by: As directed    For heavy vaginal bleeding greater than 1 pad an hour   Call MD for:  difficulty breathing, headache or visual disturbances   Complete by: As directed    Call MD for:  extreme fatigue   Complete by: As directed    Call MD for:  hives   Complete by: As directed    Call MD for:  persistant dizziness or light-headedness   Complete by: As directed    Call MD for:  persistant nausea and vomiting   Complete by: As directed    Call MD for:  severe uncontrolled pain   Complete by: As directed    Call MD for:  temperature >100.4   Complete by: As directed    Diet general   Complete by: As directed      Allergies as of 02/14/2020      Reactions   Sulfur Rash      Medication List    STOP taking these medications   polyethylene glycol 17 g packet Commonly known as: MIRALAX / GLYCOLAX     TAKE these medications   doxycycline 100 MG  tablet Commonly known as: VIBRA-TABS Take 1 tablet (100 mg total) by mouth every 12 (twelve) hours for 14 days.   hydrOXYzine 25 MG tablet Commonly known as: ATARAX/VISTARIL Take 1 tablet (25 mg total) by mouth every 6 (six) hours as needed for itching or anxiety.   ibuprofen 600 MG tablet Commonly  known as: ADVIL Take 1 tablet (600 mg total) by mouth every 6 (six) hours as needed.   oxyCODONE-acetaminophen 5-325 MG tablet Commonly known as: Percocet Take 1 tablet by mouth every 4 (four) hours as needed for severe pain.   sertraline 50 MG tablet Commonly known as: Zoloft Take 1 tablet (50 mg total) by mouth daily.       Follow-up Information    Nadara Mustard, MD. Go on 02/22/2020.   Specialty: Obstetrics and Gynecology Why: Post op appointment with Dr. Tiburcio Pea 12/29 at 10:30am Contact information: [address] [place] [address] [phone]               Signed: Vena Austria 02/14/2020, 1:30 PM

## 2020-02-15 LAB — CULTURE, BLOOD (ROUTINE X 2)
Culture: NO GROWTH
Culture: NO GROWTH

## 2020-02-17 LAB — AEROBIC/ANAEROBIC CULTURE W GRAM STAIN (SURGICAL/DEEP WOUND)
Culture: NO GROWTH
Culture: NO GROWTH
Gram Stain: NONE SEEN
Gram Stain: NONE SEEN

## 2020-02-22 ENCOUNTER — Encounter: Payer: Self-pay | Admitting: Obstetrics & Gynecology

## 2020-02-22 ENCOUNTER — Ambulatory Visit (INDEPENDENT_AMBULATORY_CARE_PROVIDER_SITE_OTHER): Payer: BC Managed Care – PPO | Admitting: Obstetrics & Gynecology

## 2020-02-22 ENCOUNTER — Other Ambulatory Visit: Payer: Self-pay

## 2020-02-22 VITALS — BP 120/80 | Ht 62.0 in | Wt 201.0 lb

## 2020-02-22 DIAGNOSIS — N7093 Salpingitis and oophoritis, unspecified: Secondary | ICD-10-CM

## 2020-02-22 DIAGNOSIS — N730 Acute parametritis and pelvic cellulitis: Secondary | ICD-10-CM

## 2020-02-22 MED ORDER — FLUCONAZOLE 150 MG PO TABS: 150.0000 mg | ORAL_TABLET | Freq: Once | ORAL | 3 refills | Status: AC

## 2020-02-22 NOTE — Progress Notes (Signed)
  Postoperative Follow-up Patient presents post op from laparoscopy and I&D of TOA for pain related to TIA and PID, 12 days ago.  Subjective: Patient reports some improvement in her preop symptoms. Eating a regular diet without difficulty. Pain is controlled with current analgesics. Medications being used: ibuprofen (OTC).  Activity: sedentary. Patient reports additional symptom's since surgery of tongue "funny feeling" like a yeast infection.  She has irreg bleeding (has IUD).  She has started Zoloft (prior use for anxiety) and it has helped.  Objective: BP 120/80   Ht 5\' 2"  (1.575 m)   Wt 201 lb (91.2 kg)   BMI 36.76 kg/m  Physical Exam Constitutional:      General: She is not in acute distress.    Appearance: She is well-developed and well-nourished.  HENT:     Head: Normocephalic.     Comments: Tongue w slight swollen papillae and pale texture Cardiovascular:     Rate and Rhythm: Normal rate.  Pulmonary:     Effort: Pulmonary effort is normal.  Abdominal:     General: There is no distension.     Palpations: Abdomen is soft.     Tenderness: There is no abdominal tenderness.     Comments: Incision Healing Well   Musculoskeletal:        General: Normal range of motion.  Neurological:     Mental Status: She is alert and oriented to person, place, and time.     Cranial Nerves: No cranial nerve deficit.  Skin:    General: Skin is warm and dry.  Psychiatric:        Mood and Affect: Mood and affect normal.     Assessment: s/p :  laparoscopy and I&D TOA stable  Plan: Patient has done well after surgery with no apparent complications.  I have discussed the post-operative course to date, and the expected progress moving forward.  The patient understands what complications to be concerned about.  I will see the patient in routine follow up, or sooner if needed.    Activity plan: May return to work.   Finish 2 week course of Doxycyline (7 more days) Plan f/u of right adnexa  in 2 weeks Diflucan for oral candidiasis Zoloft for anxiety, chronic use   [place] 02/22/2020, 10:54 AM

## 2020-03-07 ENCOUNTER — Encounter: Payer: Self-pay | Admitting: Obstetrics & Gynecology

## 2020-03-07 ENCOUNTER — Ambulatory Visit (INDEPENDENT_AMBULATORY_CARE_PROVIDER_SITE_OTHER): Payer: BC Managed Care – PPO | Admitting: Obstetrics & Gynecology

## 2020-03-07 ENCOUNTER — Other Ambulatory Visit: Payer: Self-pay

## 2020-03-07 ENCOUNTER — Other Ambulatory Visit: Payer: Self-pay | Admitting: Obstetrics and Gynecology

## 2020-03-07 ENCOUNTER — Ambulatory Visit (INDEPENDENT_AMBULATORY_CARE_PROVIDER_SITE_OTHER): Payer: BC Managed Care – PPO

## 2020-03-07 VITALS — BP 100/60 | Ht 62.0 in | Wt 199.0 lb

## 2020-03-07 DIAGNOSIS — N7093 Salpingitis and oophoritis, unspecified: Secondary | ICD-10-CM | POA: Diagnosis not present

## 2020-03-07 DIAGNOSIS — N730 Acute parametritis and pelvic cellulitis: Secondary | ICD-10-CM | POA: Diagnosis not present

## 2020-03-07 NOTE — Progress Notes (Signed)
HPI: Pt treated for PID and TOA w surgery then 2 weeks Doxycycline, for follow up today.  Says pain min and no fever, chills, d/c, bleeding.  Pt has IUD, no concerns there.  Ultrasound demonstrates right sided solid mass but smaller (50%) from prior US in Dec.  Left ovarian cyst 4 cm new.  PMHx: She  has a past medical history of ADHD (attention deficit hyperactivity disorder), Anxiety state (01/01/2018), Asthma, Bloody diarrhea, Headache(784.0), Meningitis, and Varicella. Also,  has a past surgical history that includes Adenoidectomy (2008); Tonsillectomy (2008); Colonoscopy (04/18/2011); and laparoscopy (Right, 02/12/2020)., family history includes ADD / ADHD in her brother, mother, and sister; Anxiety disorder in her brother, mother, sister, and sister; Bipolar disorder in her paternal grandfather; Depression in her paternal grandmother; Migraines in her maternal grandmother; Ulcers in her father.,  reports that she is a non-smoker but has been exposed to tobacco smoke. She has never used smokeless tobacco. She reports that she does not drink alcohol and does not use drugs.  She has a current medication list which includes the following prescription(s): ibuprofen and sertraline. Also, is allergic to sulfur.  Review of Systems  All other systems reviewed and are negative.   Objective: BP 100/60   Ht 5\' 2"  (1.575 m)   Wt 199 lb (90.3 kg)   LMP  (LMP Unknown)   BMI 36.40 kg/m   Physical examination Constitutional NAD, Conversant  Skin No rashes, lesions or ulceration.   Extremities: Moves all appropriately.  Normal ROM for age. No lymphadenopathy.  Neuro: Grossly intact  Psych: Oriented to PPT.  Normal mood. Normal affect.   PELVIC COMPLETE WITH TRANSVAGINAL  Result Date: 03/07/2020 Patient Name: 05/05/2020 A Neuner DOB: 01-26-2001 MRN: [id number] ULTRASOUND REPORT Location: [facility] Date of Service: 03/07/2020 Indications:follow up RLQ TOA Findings: The uterus is anteverted and  measures 7.4 x 5.3 x 3.3 cm. Echo texture is homogenous without evidence of focal masses. The Endometrium measures 3.4 mm. The IUD is correctly placed within the uterus. Right Ovary measures 3.3 x 3.1 x 1.8 cm.  There is a solid appearing mass off the edge of the right ovary or adjacent to it. It measures 25.0 x 17.0 x 18.2 mm. There is blood flow seen within this mass. Left Ovary measures 5.6 x 4.6 x 4.3 cm. There is a complex cyst seen in the left ovary. No blood flow is seen within. This cyst measures 34.5 x 40.5 x 37.1 mm. Survey of the adnexa demonstrates no adnexal masses. There is no free fluid in the cul de sac. Impression: 1. Normal appearing uterus and cervix. 2. The IUD is correctly placed within the uterus. 3. There is a solid mass adjacent to the right ovary. 4. There is a 4.1 cm complex cyst within the left ovary. Recommendations: 1.Clinical correlation with the patient's History and Physical Exam. 05/05/2020, RT Review of ULTRASOUND.    I have personally reviewed images and report of recent ultrasound done at [facility].    Plan of management to be discussed with patient. [facility], MD, FACOG [facility], [facility] 03/07/2020  4:31 PM   Assessment:  TOA (tubo-ovarian abscess) - Plan: 05/05/2020 PELVIC COMPLETE WITH TRANSVAGINAL  PID (acute pelvic inflammatory disease) - Plan: US PELVIC COMPLETE WITH TRANSVAGINAL  Plan monitoring for sx's and repeat US 6 weeks PID risks in future discussed Fertility concerns Plan HSG prior to trial for pregnancy (not sure when that will be)  A total of 20  minutes were spent face-to-face with the patient as well as preparation, review, communication, and documentation during this encounter.   Annamarie Major, MD, [facility], [facility] 03/07/2020  4:36 PM

## 2020-03-08 NOTE — Telephone Encounter (Signed)
Looks like a [facility] patient. Please advise

## 2020-03-08 NOTE — Telephone Encounter (Signed)
Seeing [name] Green

## 2020-03-09 NOTE — Telephone Encounter (Signed)
Looks like this is your patient

## 2020-04-18 ENCOUNTER — Ambulatory Visit (INDEPENDENT_AMBULATORY_CARE_PROVIDER_SITE_OTHER): Payer: BC Managed Care – PPO

## 2020-04-18 ENCOUNTER — Other Ambulatory Visit: Payer: Self-pay

## 2020-04-18 ENCOUNTER — Ambulatory Visit (INDEPENDENT_AMBULATORY_CARE_PROVIDER_SITE_OTHER): Payer: BC Managed Care – PPO | Admitting: Obstetrics & Gynecology

## 2020-04-18 ENCOUNTER — Other Ambulatory Visit: Payer: Self-pay | Admitting: Obstetrics & Gynecology

## 2020-04-18 ENCOUNTER — Encounter: Payer: Self-pay | Admitting: Obstetrics & Gynecology

## 2020-04-18 VITALS — BP 120/80 | Ht 62.0 in | Wt 203.0 lb

## 2020-04-18 DIAGNOSIS — N730 Acute parametritis and pelvic cellulitis: Secondary | ICD-10-CM

## 2020-04-18 DIAGNOSIS — N7093 Salpingitis and oophoritis, unspecified: Secondary | ICD-10-CM

## 2020-04-18 NOTE — Progress Notes (Signed)
  HPI: Pt reports improved pain and reg cycles, no fever or vag discharge.  She has problems w BMs and starining at times.   Ultrasound demonstrates right sided hydrosalpinx that is slightly smaller than last scan, and left ovarian cyst as well  PMHx: She  has a past medical history of ADHD (attention deficit hyperactivity disorder), Anxiety state (01/01/2018), Asthma, Bloody diarrhea, Headache(784.0), Meningitis, and Varicella. Also,  has a past surgical history that includes Adenoidectomy (2008); Tonsillectomy (2008); Colonoscopy (04/18/2011); and laparoscopy (Right, 02/12/2020)., family history includes ADD / ADHD in her brother, mother, and sister; Anxiety disorder in her brother, mother, sister, and sister; Bipolar disorder in her paternal grandfather; Depression in her paternal grandmother; Migraines in her maternal grandmother; Ulcers in her father.,  reports that she is a non-smoker but has been exposed to tobacco smoke. She has never used smokeless tobacco. She reports that she does not drink alcohol and does not use drugs.  She has a current medication list which includes the following prescription(s): sertraline and ibuprofen. Also, is allergic to elemental sulfur.  Review of Systems  All other systems reviewed and are negative.   Objective: BP 120/80   Ht 5\' 2"  (1.575 m)   Wt 203 lb (92.1 kg)   LMP 04/13/2020   BMI 37.13 kg/m   Physical examination Constitutional NAD, Conversant  Skin No rashes, lesions or ulceration.   Extremities: Moves all appropriately.  Normal ROM for age. No lymphadenopathy.  Neuro: Grossly intact  Psych: Oriented to PPT.  Normal mood. Normal affect.   04/15/2020 PELVIS TRANSVAGINAL NON-OB (TV ONLY)  Result Date: 04/18/2020 Patient Name: [name] Green DOB: 2000-06-28 MRN: [id number] ULTRASOUND REPORT Location: [facility] Date of Service: 04/18/2020 Indications:Follow up TOA Findings: The uterus is anteverted and measures 7.4 x 4.4 x 3.4cm. Echo texture is  homogenous without evidence of focal masses. The Endometrium measures 2.1 mm. IUD in place. Right Ovary measures 3.5 x 2.8 x 2.7 cm. It is normal in appearance. Complex mass adjacent to right ovary. Residual abscess? Measuring 2.0 x 1.8 x 1.4cm. Appears slightly improved/smaller from previous. Left Ovary measures 4.9 x 3.5 x 3.0 cm. Cyst with internal debris measuring 4.0cm. Appears improved from previous. Survey of the adnexa demonstrates no adnexal masses. There is no free fluid in the cul de sac. Impression: 1. Right adnexal mass adjacent to ovary. Appears slightly improved from previous study. 2. Left ovarian cyst with internal debris. Appears improved from previous study. Recommendations: 1.Clinical correlation with the patient's History and Physical Exam. [name], RT Review of ULTRASOUND.    I have personally reviewed images and report of recent ultrasound done at [facility].    Plan of management to be discussed with patient. [facility], MD, FACOG [facility], [facility] 04/18/2020  2:44 PM    Assessment:  TOA (tubo-ovarian abscess) No pain or sx's, so will monitor for now. Fertility and pregnancy discussed, plan [name] and HSG prior to trying  A total of 20 minutes were spent face-to-face with the patient as well as preparation, review, communication, and documentation during this encounter.   [place], MD, [facility], [facility] 04/18/2020  2:47 PM

## 2020-11-06 ENCOUNTER — Telehealth: Payer: BC Managed Care – PPO

## 2020-11-06 ENCOUNTER — Other Ambulatory Visit: Payer: Self-pay | Admitting: Obstetrics & Gynecology

## 2020-11-06 NOTE — Telephone Encounter (Signed)
Pt calling; needs refill on medication; is in process of getting an appt c psychiatrist; only has a few pills left.  [phone]  Adv pt refill eRx'd earlier today.

## 2021-02-28 ENCOUNTER — Encounter: Payer: Self-pay | Admitting: Obstetrics

## 2021-02-28 ENCOUNTER — Other Ambulatory Visit: Payer: Self-pay

## 2021-02-28 ENCOUNTER — Ambulatory Visit (INDEPENDENT_AMBULATORY_CARE_PROVIDER_SITE_OTHER): Payer: BC Managed Care – PPO | Admitting: Obstetrics

## 2021-02-28 ENCOUNTER — Other Ambulatory Visit (HOSPITAL_COMMUNITY)
Admission: RE | Admit: 2021-02-28 | Discharge: 2021-02-28 | Disposition: A | Discharge status: Home / Self Care | Payer: BC Managed Care – PPO | Source: Ambulatory Visit | Attending: Obstetrics | Admitting: Obstetrics

## 2021-02-28 VITALS — BP 126/84 | Ht 62.0 in | Wt 234.0 lb

## 2021-02-28 DIAGNOSIS — Z113 Encounter for screening for infections with a predominantly sexual mode of transmission: Secondary | ICD-10-CM | POA: Insufficient documentation

## 2021-02-28 DIAGNOSIS — N898 Other specified noninflammatory disorders of vagina: Secondary | ICD-10-CM

## 2021-02-28 NOTE — Progress Notes (Signed)
Gynecology STD Evaluation   hChief Complaint:  Chief Complaint  Patient presents with   Vaginitis    History of Present Illness: Patient is a 21 y.o. No obstetric history on file. presents for STD testing.  The patient has not noted intermenstrual spotting,  has not experienced postcoital bleeding, and does report increased vaginal discharge.  There is a history of prior sexually transmitted infection(s).     The patient is sexually active and reports 2 lifetime sexual partners . She currently uses IUD: Present: MIrena  for contraception. She never uses condoms or barrier methods to prevent the spread of sexually transmitted infections.  The patient has not been previously transfused or tattooed.    Review of Systems: 10 point review of systems negative unless otherwise noted in HPI  Past Medical History:  Past Medical History:  Diagnosis Date   ADHD (attention deficit hyperactivity disorder)    Anxiety state 01/01/2018   Asthma    Bloody diarrhea    With mucous   Headache(784.0)    Tues and Fever-100.6   Meningitis    as an inafant   Varicella     Past Surgical History:  Past Surgical History:  Procedure Laterality Date   ADENOIDECTOMY  2008   COLONOSCOPY  04/18/2011   Procedure: COLONOSCOPY;  Surgeon: Jon Gills, MD;  Location: [facility] OR;  Service: Gastroenterology;  Laterality: N/A;   LAPAROSCOPY Right 02/12/2020   Procedure: LAPAROSCOPY OPERATIVE, DRAINAGE OF RIGHT TUBOVARIAN ABCESS;  Surgeon: Nadara Mustard, MD;  Location: [facility] ORS;  Service: Gynecology;  Laterality: Right;   TONSILLECTOMY  2008    Gynecologic History: Patient's last menstrual period was 02/26/2021 (exact date).  Obstetric History: No obstetric history on file.  Family History:  Family History  Problem Relation Age of Onset   ADD / ADHD Sister    Anxiety disorder Sister    Anxiety disorder Sister    ADD / ADHD Brother    Anxiety disorder Brother    Ulcers Father    ADD / ADHD Mother     Anxiety disorder Mother    Migraines Maternal Grandmother    Depression Paternal Grandmother    Bipolar disorder Paternal Grandfather    Seizures Neg Hx    Autism Neg Hx    Schizophrenia Neg Hx     Social History:  Social History   Socioeconomic History   Marital status: Single    Spouse name: Not on file   Number of children: Not on file   Years of education: Not on file   Highest education level: Not on file  Occupational History   Not on file  Tobacco Use   Smoking status: Passive Smoke Exposure - Never Smoker   Smokeless tobacco: Never  Substance and Sexual Activity   Alcohol use: No   Drug use: No   Sexual activity: Never  Other Topics Concern   Not on file  Social History Narrative   Lives at home with mom, and twin sister. She is in the 12th grade at [employer]. She enjoys cheering eating and hanging out with friends.    Social Determinants of Health   Financial Resource Strain: Not on file  Food Insecurity: Not on file  Transportation Needs: Not on file  Physical Activity: Not on file  Stress: Not on file  Social Connections: Not on file  Intimate Partner Violence: Not on file    Allergies:  Allergies  Allergen Reactions   Elemental Sulfur Rash  Medications: Prior to Admission medications   Medication Sig Start Date End Date Taking? Authorizing Provider  ibuprofen (ADVIL) 600 MG tablet Take 1 tablet (600 mg total) by mouth every 6 (six) hours as needed. 02/14/20  Yes Vena Austria, MD  levonorgestrel (MIRENA) 20 MCG/DAY IUD 1 each by Intrauterine route once.   Yes [provider]  sertraline (ZOLOFT) 50 MG tablet TAKE 1 TABLET BY MOUTH EVERY DAY 11/06/20  Yes Nadara Mustard, MD    Physical Exam Blood pressure 126/84, height 5\' 2"  (1.575 m), weight 234 lb (106.1 kg), last menstrual period 02/26/2021. Patient's last menstrual period was 02/26/2021 (exact date).  General: NAD HEENT: normocephalic, anicteric Thyroid: no  enlargement, no palpable nodules Pulmonary: No increased work of breathing, CTAB Cardiovascular: RRR, distal pulses 2+ Breast: Breast symmetrical, no tenderness, no palpable nodules or masses, no skin or nipple retraction present, no nipple discharge.  No axillary or supraclavicular lymphadenopathy. Abdomen: NABS, soft, non-tender, non-distended.  Umbilicus without lesions.  No hepatomegaly, splenomegaly or masses palpable. No evidence of hernia  Genitourinary:  External: Normal external female genitalia.  Normal urethral meatus, normal  Bartholin's and Skene's glands.    Vagina: Normal vaginal mucosa, no evidence of prolapse.    Cervix: Grossly normal in appearance, no bleeding  Uterus: Non-enlarged, mobile, normal contour.  No CMT  Adnexa: ovaries non-enlarged, no adnexal masses  Rectal: deferred  Lymphatic: no evidence of inguinal lymphadenopathy Extremities: no edema, erythema, or tenderness Neurologic: Grossly intact Psychiatric: mood appropriate, affect full  Female chaperone present for pelvic and breast  portions of the physical exam  Assessment: 21 y.o. No obstetric history on file. presenting for STD screening Plan: Problem List Items Addressed This Visit   None Visit Diagnoses     Routine screening for STI (sexually transmitted infection)    -  Primary   Relevant Orders   Cervicovaginal ancillary only   HEP, RPR, HIV Panel   Vaginal discharge       Relevant Orders   Cervicovaginal ancillary only        1) Contraception -We reivewed her IUD use and she is happy with this method. We discussed that contraception, while reliable in preventing unintended pregnancy does not mitigate her risk of STI.  We discussed safe sex practices to reduce her furture risk of STI's.  Patient is interested in  using condoms regularly I encouragd her to ask her BF to get STI testing.26  2) STD prevention  She will commence with pap smears next year at age 33.  4) No follow-ups on  file.   36, CNM  02/28/2021 2:38 PM   [facility],  [facility] 02/28/2021, 2:35 PM

## 2021-03-01 LAB — HEP, RPR, HIV PANEL
HIV Screen 4th Generation wRfx: NONREACTIVE
Hepatitis B Surface Ag: NEGATIVE
RPR Ser Ql: NONREACTIVE

## 2021-03-02 LAB — CERVICOVAGINAL ANCILLARY ONLY
Bacterial Vaginitis (gardnerella): POSITIVE — AB
Candida Glabrata: NEGATIVE
Candida Vaginitis: NEGATIVE
Chlamydia: NEGATIVE
Comment: NEGATIVE
Comment: NEGATIVE
Comment: NEGATIVE
Comment: NEGATIVE
Comment: NEGATIVE
Comment: NORMAL
Neisseria Gonorrhea: NEGATIVE
Trichomonas: NEGATIVE

## 2021-03-04 ENCOUNTER — Other Ambulatory Visit: Payer: Self-pay | Admitting: Obstetrics

## 2021-03-04 ENCOUNTER — Encounter: Payer: Self-pay | Admitting: Obstetrics

## 2021-03-04 DIAGNOSIS — N760 Acute vaginitis: Secondary | ICD-10-CM

## 2021-03-04 DIAGNOSIS — B9689 Other specified bacterial agents as the cause of diseases classified elsewhere: Secondary | ICD-10-CM

## 2021-03-04 MED ORDER — METRONIDAZOLE 500 MG PO TABS
500.0000 mg | ORAL_TABLET | Freq: Two times a day (BID) | ORAL | 0 refills | Status: AC
Start: 2021-03-04 — End: 2021-03-11

## 2021-06-03 ENCOUNTER — Other Ambulatory Visit: Payer: Self-pay | Admitting: Obstetrics & Gynecology

## 2021-06-05 ENCOUNTER — Telehealth: Payer: Self-pay

## 2021-06-05 ENCOUNTER — Other Ambulatory Visit: Payer: Self-pay | Admitting: Obstetrics & Gynecology

## 2021-06-05 ENCOUNTER — Other Ambulatory Visit: Payer: Self-pay | Admitting: Obstetrics

## 2021-06-05 NOTE — Telephone Encounter (Signed)
Pt calling and needing a refill on her zoloft, seeing [facility] Tuesday. But is completely out. I told her I would TRY to send to who was on call , but I cant make any promises given its anxiety medication and not [facility] pt. Please advise on refill to get pt to appt next week ?

## 2021-06-07 NOTE — Telephone Encounter (Signed)
Pt aware. She is following up with someone who can manage this, [facility] [place] ?

## 2021-06-11 ENCOUNTER — Other Ambulatory Visit (HOSPITAL_COMMUNITY)
Admission: RE | Admit: 2021-06-11 | Discharge: 2021-06-11 | Disposition: A | Discharge status: Home / Self Care | Payer: BC Managed Care – PPO | Source: Ambulatory Visit | Attending: Obstetrics & Gynecology | Admitting: Obstetrics & Gynecology

## 2021-06-11 ENCOUNTER — Ambulatory Visit (INDEPENDENT_AMBULATORY_CARE_PROVIDER_SITE_OTHER): Payer: BC Managed Care – PPO | Admitting: Obstetrics & Gynecology

## 2021-06-11 ENCOUNTER — Encounter: Payer: Self-pay | Admitting: Obstetrics & Gynecology

## 2021-06-11 VITALS — BP 120/90 | Ht 62.0 in | Wt 246.0 lb

## 2021-06-11 DIAGNOSIS — N760 Acute vaginitis: Secondary | ICD-10-CM | POA: Insufficient documentation

## 2021-06-11 DIAGNOSIS — B9689 Other specified bacterial agents as the cause of diseases classified elsewhere: Secondary | ICD-10-CM | POA: Diagnosis not present

## 2021-06-11 DIAGNOSIS — N898 Other specified noninflammatory disorders of vagina: Secondary | ICD-10-CM | POA: Diagnosis not present

## 2021-06-11 MED ORDER — METRONIDAZOLE 0.75 % VA GEL: 1.0000 | Freq: Every day | VAGINAL | 0 refills | Status: AC

## 2021-06-11 NOTE — Progress Notes (Signed)
? ?HPI: ?     [name] is a 21 y.o. G0 who LMP was Patient's last menstrual period was 05/15/2021., she presents today for her annual examination. The patient has no complaints today other than vaginal odor c/w prior BV dx in January.  She took Flagyl then, and it helped.  She does report using Burnell Blanks weekly.  She has recently changed soaps. ? ?The patient is sexually active. Her no prior history of gyn screening tests. The patient does perform self breast exams.  There is no notable family history of breast or ovarian cancer in her family.  The patient has regular exercise: yes.  The patient denies current symptoms of depression.   ? ?GYN History: ?Contraception: IUD ? ?PMHx: ?Past Medical History:  ?Diagnosis Date  ? ADHD (attention deficit hyperactivity disorder)   ? Anxiety state 01/01/2018  ? Asthma   ? Bloody diarrhea   ? With mucous  ? Headache(784.0)   ? Tues and Fever-100.6  ? Meningitis   ? as an inafant  ? Varicella   ? ?Past Surgical History:  ?Procedure Laterality Date  ? ADENOIDECTOMY  2008  ? COLONOSCOPY  04/18/2011  ? Procedure: COLONOSCOPY;  Surgeon: Oletha Blend, MD;  Location: [place];  Service: Gastroenterology;  Laterality: N/A;  ? LAPAROSCOPY Right 02/12/2020  ? Procedure: LAPAROSCOPY OPERATIVE, DRAINAGE OF RIGHT TUBOVARIAN ABCESS;  Surgeon: Gae Dry, MD;  Location: [facility] ORS;  Service: Gynecology;  Laterality: Right;  ? TONSILLECTOMY  2008  ? ?Family History  ?Problem Relation Age of Onset  ? ADD / ADHD Sister   ? Anxiety disorder Sister   ? Anxiety disorder Sister   ? ADD / ADHD Brother   ? Anxiety disorder Brother   ? Ulcers Father   ? ADD / ADHD Mother   ? Anxiety disorder Mother   ? Migraines Maternal Grandmother   ? Depression Paternal Grandmother   ? Bipolar disorder Paternal Grandfather   ? Seizures Neg Hx   ? Autism Neg Hx   ? Schizophrenia Neg Hx   ? ?Social History  ? ?Tobacco Use  ? Smoking status: Never  ?  Passive exposure: Yes  ? Smokeless tobacco: Never   ?Substance Use Topics  ? Alcohol use: No  ? Drug use: No  ? ? ?Current Outpatient Medications:  ?  levonorgestrel (MIRENA) 20 MCG/DAY IUD, 1 each by Intrauterine route once., Disp: , Rfl:  ?  metroNIDAZOLE (METROGEL) 0.75 % vaginal gel, Place 1 Applicatorful vaginally at bedtime for 5 days., Disp: 70 g, Rfl: 0 ?  sertraline (ZOLOFT) 50 MG tablet, TAKE 1 TABLET BY MOUTH EVERY DAY, Disp: 90 tablet, Rfl: 1 ?  ibuprofen (ADVIL) 600 MG tablet, Take 1 tablet (600 mg total) by mouth every 6 (six) hours as needed. (Patient not taking: Reported on 06/11/2021), Disp: 60 tablet, Rfl: 3 ?Allergies: Elemental sulfur ? ?Review of Systems  ?Constitutional:  Negative for chills, fever and malaise/fatigue.  ?HENT:  Negative for congestion, sinus pain and sore throat.   ?Eyes:  Negative for blurred vision and pain.  ?Respiratory:  Negative for cough and wheezing.   ?Cardiovascular:  Negative for chest pain and leg swelling.  ?Gastrointestinal:  Negative for abdominal pain, constipation, diarrhea, heartburn, nausea and vomiting.  ?Genitourinary:  Negative for dysuria, frequency, hematuria and urgency.  ?Musculoskeletal:  Negative for back pain, joint pain, myalgias and neck pain.  ?Skin:  Negative for itching and rash.  ?Neurological:  Negative for dizziness, tremors and  weakness.  ?Endo/Heme/Allergies:  Does not bruise/bleed easily.  ?Psychiatric/Behavioral:  Negative for depression. The patient is not nervous/anxious and does not have insomnia.   ? ?Objective: ?BP 120/90   Ht 5\' 2"  (1.575 m)   Wt 246 lb (111.6 kg)   LMP 05/15/2021   BMI 44.99 kg/m?   ?Filed Weights  ? 06/11/21 0841  ?Weight: 246 lb (111.6 kg)  ? Body mass index is 44.99 kg/m?Marland Kitchen ?Physical Exam ?Constitutional:   ?   General: She is not in acute distress. ?   Appearance: She is well-developed.  ?Genitourinary:  ?   Bladder, rectum and urethral meatus normal.  ?   No lesions in the vagina.  ?   Right Labia: No rash, tenderness or lesions. ?   Left Labia: No  tenderness, lesions or rash. ?   No vaginal bleeding.  ? ?   Right Adnexa: not tender and no mass present. ?   Left Adnexa: not tender and no mass present. ?   No cervical motion tenderness, friability, lesion or polyp.  ?   Uterus is not enlarged.  ?   No uterine mass detected. ?   Pelvic exam was performed with patient in the lithotomy position.  ?Breasts: ?   Right: No mass, skin change or tenderness.  ?   Left: No mass, skin change or tenderness.  ?HENT:  ?   Head: Normocephalic and atraumatic. No laceration.  ?   Right Ear: Hearing normal.  ?   Left Ear: Hearing normal.  ?   Mouth/Throat:  ?   Pharynx: Uvula midline.  ?Eyes:  ?   Pupils: Pupils are equal, round, and reactive to light.  ?Neck:  ?   Thyroid: No thyromegaly.  ?Cardiovascular:  ?   Rate and Rhythm: Normal rate and regular rhythm.  ?   Heart sounds: No murmur heard. ?  No friction rub. No gallop.  ?Pulmonary:  ?   Effort: Pulmonary effort is normal. No respiratory distress.  ?   Breath sounds: Normal breath sounds. No wheezing.  ?Abdominal:  ?   General: Bowel sounds are normal. There is no distension.  ?   Palpations: Abdomen is soft.  ?   Tenderness: There is no abdominal tenderness. There is no rebound.  ?Musculoskeletal:     ?   General: Normal range of motion.  ?   Cervical back: Normal range of motion and neck supple.  ?Neurological:  ?   Mental Status: She is alert and oriented to person, place, and time.  ?   Cranial Nerves: No cranial nerve deficit.  ?Skin: ?   General: Skin is warm and dry.  ?Psychiatric:     ?   Judgment: Judgment normal.  ?Vitals reviewed.  ? ? ?Assessment:  ANNUAL EXAM ?1. BV (bacterial vaginosis)   ?2. Vaginal discharge   ? ? ? ?Screening Plan: ?           ?1.  Cervical Screening-  wait til she is 21 yo ? ?2. Breast screening- Exam annually and mammogram>40 planned  ? ?3. BV- retest today ?Metrogel planned if positive ? ?4. Labs  none ? ?5. Counseling for contraception: IUD ? [facility] - 06/11/21 0843   ? ?  ? Pregnancy  Intention Screening  ? Does the patient want to become pregnant in the next year? No   ? Does the patient's partner want to become pregnant in the next year? No   ? Would the patient like to discuss contraceptive  options today? No   ?  ? Contraception Wrap Up  ? Current Method IUD or IUS   ? End Method IUD or IUS   ? Contraception Counseling Provided No   ? ?  ?  ? ?  ? ?The pregnancy intention screening data noted above was reviewed. Potential methods of contraception were discussed. The patient elected to proceed with IUD or IUS.  ? ?  F/U ? Return in about 1 year (around 06/12/2022) for and Annual when due. ? ?Barnett Applebaum, MD, [place] ?[facility], [place] ?06/11/2021  8:56 AM ? ? ?

## 2021-06-12 LAB — CERVICOVAGINAL ANCILLARY ONLY
Bacterial Vaginitis (gardnerella): POSITIVE — AB
Candida Glabrata: NEGATIVE
Candida Vaginitis: NEGATIVE
Comment: NEGATIVE
Comment: NEGATIVE
Comment: NEGATIVE

## 2021-06-14 ENCOUNTER — Other Ambulatory Visit: Payer: Self-pay | Admitting: Obstetrics and Gynecology

## 2021-06-14 MED ORDER — METRONIDAZOLE 500 MG PO TABS: 500.0000 mg | ORAL_TABLET | Freq: Two times a day (BID) | ORAL | 0 refills | Status: DC

## 2021-06-20 ENCOUNTER — Telehealth: Payer: Self-pay

## 2021-06-20 NOTE — Telephone Encounter (Signed)
Pt returned call; adv tablet form of metronidazole was sent in for her by [facility] on the 21st; to call pharm to be sure it's ready before going to p/u. ?

## 2021-06-20 NOTE — Telephone Encounter (Signed)
Pt calling; was rx'd metronidazole gel to use for 5d; finished that Monday HS; doesn't think BV hasn't gone away completely b/c she still has the order.  [phone]  [place] ?

## 2021-07-01 DIAGNOSIS — J069 Acute upper respiratory infection, unspecified: Secondary | ICD-10-CM | POA: Diagnosis not present

## 2021-07-01 DIAGNOSIS — H66002 Acute suppurative otitis media without spontaneous rupture of ear drum, left ear: Secondary | ICD-10-CM | POA: Diagnosis not present

## 2021-12-16 DIAGNOSIS — U071 COVID-19: Secondary | ICD-10-CM | POA: Diagnosis not present

## 2021-12-16 DIAGNOSIS — J029 Acute pharyngitis, unspecified: Secondary | ICD-10-CM | POA: Diagnosis not present

## 2021-12-23 ENCOUNTER — Other Ambulatory Visit: Payer: Self-pay

## 2021-12-23 DIAGNOSIS — F411 Generalized anxiety disorder: Secondary | ICD-10-CM | POA: Diagnosis not present

## 2022-01-06 DIAGNOSIS — F411 Generalized anxiety disorder: Secondary | ICD-10-CM | POA: Diagnosis not present

## 2022-01-10 DIAGNOSIS — J019 Acute sinusitis, unspecified: Secondary | ICD-10-CM | POA: Diagnosis not present

## 2022-01-20 DIAGNOSIS — F411 Generalized anxiety disorder: Secondary | ICD-10-CM | POA: Diagnosis not present

## 2022-01-24 DIAGNOSIS — F411 Generalized anxiety disorder: Secondary | ICD-10-CM | POA: Diagnosis not present

## 2022-01-28 DIAGNOSIS — F411 Generalized anxiety disorder: Secondary | ICD-10-CM | POA: Diagnosis not present

## 2022-02-04 DIAGNOSIS — F411 Generalized anxiety disorder: Secondary | ICD-10-CM | POA: Diagnosis not present

## 2022-02-14 DIAGNOSIS — F411 Generalized anxiety disorder: Secondary | ICD-10-CM | POA: Diagnosis not present

## 2022-02-28 DIAGNOSIS — F411 Generalized anxiety disorder: Secondary | ICD-10-CM | POA: Diagnosis not present

## 2022-03-14 DIAGNOSIS — J0100 Acute maxillary sinusitis, unspecified: Secondary | ICD-10-CM | POA: Diagnosis not present

## 2022-03-14 DIAGNOSIS — J101 Influenza due to other identified influenza virus with other respiratory manifestations: Secondary | ICD-10-CM | POA: Diagnosis not present

## 2022-03-14 DIAGNOSIS — R6883 Chills (without fever): Secondary | ICD-10-CM | POA: Diagnosis not present

## 2022-03-14 DIAGNOSIS — F411 Generalized anxiety disorder: Secondary | ICD-10-CM | POA: Diagnosis not present

## 2022-03-14 DIAGNOSIS — Z760 Encounter for issue of repeat prescription: Secondary | ICD-10-CM | POA: Diagnosis not present

## 2022-03-21 DIAGNOSIS — F411 Generalized anxiety disorder: Secondary | ICD-10-CM | POA: Diagnosis not present

## 2022-03-27 DIAGNOSIS — F411 Generalized anxiety disorder: Secondary | ICD-10-CM | POA: Diagnosis not present

## 2022-04-04 DIAGNOSIS — F411 Generalized anxiety disorder: Secondary | ICD-10-CM | POA: Diagnosis not present

## 2022-04-11 DIAGNOSIS — F411 Generalized anxiety disorder: Secondary | ICD-10-CM | POA: Diagnosis not present

## 2022-04-24 DIAGNOSIS — F411 Generalized anxiety disorder: Secondary | ICD-10-CM | POA: Diagnosis not present

## 2022-12-03 ENCOUNTER — Encounter: Payer: Self-pay | Admitting: Obstetrics

## 2022-12-03 ENCOUNTER — Ambulatory Visit (INDEPENDENT_AMBULATORY_CARE_PROVIDER_SITE_OTHER): Payer: No Typology Code available for payment source | Admitting: Obstetrics

## 2022-12-03 ENCOUNTER — Other Ambulatory Visit (HOSPITAL_COMMUNITY)
Admission: RE | Admit: 2022-12-03 | Discharge: 2022-12-03 | Disposition: A | Discharge status: Home / Self Care | Payer: No Typology Code available for payment source | Source: Ambulatory Visit | Attending: Obstetrics | Admitting: Obstetrics

## 2022-12-03 VITALS — BP 117/75 | HR 92 | Ht 62.0 in | Wt 167.0 lb

## 2022-12-03 DIAGNOSIS — Z30432 Encounter for removal of intrauterine contraceptive device: Secondary | ICD-10-CM | POA: Diagnosis not present

## 2022-12-03 DIAGNOSIS — Z01419 Encounter for gynecological examination (general) (routine) without abnormal findings: Secondary | ICD-10-CM | POA: Insufficient documentation

## 2022-12-03 DIAGNOSIS — Z124 Encounter for screening for malignant neoplasm of cervix: Secondary | ICD-10-CM

## 2022-12-03 NOTE — Progress Notes (Signed)
GYNECOLOGY ANNUAL PHYSICAL EXAM PROGRESS NOTE  Subjective:    [name] Green is a 22 y.o. No obstetric history on file. female who presents for an annual exam. She wants to have her IUD removed, so she can see how her body is without hormones. The patient is sexually active. The patient participates in regular exercise: no. Has the patient ever been transfused or tattooed?: yes. The patient reports that there is not domestic violence in her life.  [name] has lost a great deal of weight using the [employer] method. She feels much better physically as well as emotionally. Is now in a stable relationship for over a year, and they are talking about permanence. This is very different from the self described " drama" she grew up in. They use condoms strictly, and she has an IUD that she wants removed today. She has been on hormonal birth control starting at age 15. Would like to see if IUD removal and non hormonal BC will improve her moods more. She continues doing counseling and this has been very helpful. Recently had full STI testing and everything was negative. Her partner was also tested.  The patient has the following complaints today: Desires IUD removal Needs a pap smear.has never had a pap.  Menstrual History: Menarche age: 57 No LMP recorded.   No periods on the IUD. Denies any pelvic pain, vaginal discharge, odor or abnormal bleeding  Gynecologic History:  Contraception: IUD History of STI's: No Last Pap: n/a.        OB History  No obstetric history on file.    Past Medical History:  Diagnosis Date   ADHD (attention deficit hyperactivity disorder)    Anxiety state 01/01/2018   Asthma    Bloody diarrhea    With mucous   Headache(784.0)    Tues and Fever-100.6   Meningitis    as an inafant   Varicella     Past Surgical History:  Procedure Laterality Date   ADENOIDECTOMY  2008   COLONOSCOPY  04/18/2011   Procedure: COLONOSCOPY;  Surgeon: Jon Gills, MD;   Location: [facility] OR;  Service: Gastroenterology;  Laterality: N/A;   LAPAROSCOPY Right 02/12/2020   Procedure: LAPAROSCOPY OPERATIVE, DRAINAGE OF RIGHT TUBOVARIAN ABCESS;  Surgeon: Nadara Mustard, MD;  Location: [facility] ORS;  Service: Gynecology;  Laterality: Right;   TONSILLECTOMY  2008    Family History  Problem Relation Age of Onset   ADD / ADHD Sister    Anxiety disorder Sister    Anxiety disorder Sister    ADD / ADHD Brother    Anxiety disorder Brother    Ulcers Father    ADD / ADHD Mother    Anxiety disorder Mother    Migraines Maternal Grandmother    Depression Paternal Grandmother    Bipolar disorder Paternal Grandfather    Seizures Neg Hx    Autism Neg Hx    Schizophrenia Neg Hx     Social History   Socioeconomic History   Marital status: Single    Spouse name: Not on file   Number of children: Not on file   Years of education: Not on file   Highest education level: Not on file  Occupational History   Not on file  Tobacco Use   Smoking status: Never    Passive exposure: Yes   Smokeless tobacco: Never  Substance and Sexual Activity   Alcohol use: No   Drug use: No   Sexual activity: Never  Other Topics  Concern   Not on file  Social History Narrative   Lives at home with mom, and twin sister. She is in the 12th grade at [employer]. She enjoys cheering eating and hanging out with friends.    Social Determinants of Health   Financial Resource Strain: Not on file  Food Insecurity: Not on file  Transportation Needs: Not on file  Physical Activity: Not on file  Stress: Not on file  Social Connections: Not on file  Intimate Partner Violence: Not on file    Current Outpatient Medications on File Prior to Visit  Medication Sig Dispense Refill   ibuprofen (ADVIL) 600 MG tablet Take 1 tablet (600 mg total) by mouth every 6 (six) hours as needed. (Patient not taking: Reported on 06/11/2021) 60 tablet 3   levonorgestrel (MIRENA) 20 MCG/DAY IUD 1 each by  Intrauterine route once.     metroNIDAZOLE (FLAGYL) 500 MG tablet Take 1 tablet (500 mg total) by mouth 2 (two) times daily. 14 tablet 0   sertraline (ZOLOFT) 50 MG tablet TAKE 1 TABLET BY MOUTH EVERY DAY 90 tablet 1   No current facility-administered medications on file prior to visit.    Allergies  Allergen Reactions   Elemental Sulfur Rash     Review of Systems Constitutional: negative for chills, fatigue, fevers and sweats Eyes: negative for irritation, redness and visual disturbance Ears, nose, mouth, throat, and face: negative for hearing loss, nasal congestion, snoring and tinnitus Respiratory: negative for asthma, cough, sputum Cardiovascular: negative for chest pain, dyspnea, exertional chest pressure/discomfort, irregular heart beat, palpitations and syncope Gastrointestinal: negative for abdominal pain, change in bowel habits, nausea and vomiting Genitourinary: negative for abnormal menstrual periods, genital lesions, sexual problems and vaginal discharge, dysuria and urinary incontinence Integument/breast: negative for breast lump, breast tenderness and nipple discharge Hematologic/lymphatic: negative for bleeding and easy bruising Musculoskeletal:negative for back pain and muscle weakness Neurological: negative for dizziness, headaches, vertigo and weakness Endocrine: negative for diabetic symptoms including polydipsia, polyuria and skin dryness Allergic/Immunologic: negative for hay fever and urticaria      Objective:  BP 117/75   Pulse 92   Ht 5\' 2"  (1.575 m)   Wt 167 lb (75.8 kg)   LMP 11/09/2022   BMI 30.54 kg/m    General Appearance:    Alert, cooperative, no distress, appears stated age  Head:    Normocephalic, without obvious abnormality, atraumatic  Eyes:    PERRL, conjunctiva/corneas clear, EOM's intact, both eyes           Neck:   Supple, symmetrical, trachea midline, no adenopathy; thyroid: no enlargement/tenderness/nodules; no carotid bruit or JVD   Back:     Symmetric, no curvature, ROM normal, no CVA tenderness  Lungs:     Clear to auscultation bilaterally, respirations unlabored  Chest Wall:    No tenderness or deformity   Heart:    Regular rate and rhythm, S1 and S2 normal, no murmur, rub or gallop  Breast Exam:    No tenderness, masses, or nipple abnormality. Fibrocystic changes noted. BSE taught. Decreased turgor since weight loss noted.  Abdomen:     Soft, non-tender, bowel sounds active all four quadrants, no masses, no organomegaly.    Genitalia:    Pelvic:external genitalia normal, vagina without lesions, discharge, or tenderness, rectovaginal septum  normal. Cervix normal in appearance, no cervical motion tenderness, no adnexal masses or tenderness.  Uterus normal size, shape, mobile, regular contours, nontender. Anteverted. PAP retrieved.  Rectal:    Normal  external sphincter.  No hemorrhoids appreciated. Internal exam not done.   Extremities:   Extremities normal, atraumatic, no cyanosis or edema  Pulses:   2+ and symmetric all extremities  Skin:   Skin color, texture, turgor normal, no rashes or lesions  Lymph nodes:   Cervical, supraclavicular, and axillary nodes normal  Neurologic:   CNII-XII intact, normal strength, sensation and reflexes throughout   .  Labs:  Lab Results  Component Value Date   WBC 13.5 (H) 02/14/2020   HGB 8.4 (L) 02/14/2020   HCT 26.6 (L) 02/14/2020   MCV 83.9 02/14/2020   PLT 280 02/14/2020    Lab Results  Component Value Date   CREATININE 0.82 02/09/2020   BUN 8 02/09/2020   NA 133 (L) 02/09/2020   K 3.8 02/09/2020   CL 100 02/09/2020   CO2 22 02/09/2020    Lab Results  Component Value Date   ALT 22 02/09/2020   AST 17 02/09/2020   ALKPHOS 66 02/09/2020   BILITOT 0.7 02/09/2020    No results found for: "TSH"   Assessment:   Annual GYN exam IUD removal   Plan:  Blood tests: none. Breast self exam technique reviewed and patient encouraged to perform self-exam  monthly. Contraception: condoms. Discussed healthy lifestyle modifications. Mammogram  not due Pap smear ordered and retrieved. Flu vaccine:declined, per her PCP  Follow up in 1 year for annual exam  IUD removal: IUD Removal  Patient identified, informed consent performed, consent signed.  Patient was in the dorsal lithotomy position, normal external genitalia was noted.  A speculum was placed in the patient's vagina, normal discharge was noted, no lesions. The cervix was visualized, no lesions, no abnormal discharge.  The strings of the IUD were grasped and pulled using ring forceps. The IUD was removed in its entirety.  Patient tolerated the procedure well.    Patient will use condoms for contraception/ Routine preventative health maintenance measures emphasized.   Mirna Mires, CNM  12/03/2022 4:35 PM   [facility]

## 2022-12-04 ENCOUNTER — Telehealth: Payer: Self-pay

## 2022-12-04 NOTE — Telephone Encounter (Signed)
Pt calling; had IUD removed yesterday; had light bleeding pm and again this morning; now she is bleeding heavier than she is used to; period is supposed to start in three days but she isn't exactly like clockwork. Is this normal?  [phone]  Pt states she is not saturating a pad every 30 minutes to an hour; adv she is okay.  Pt aware if bleeding does get that heavy it is our policy that she go to the ER.  Pt reassured.

## 2022-12-08 LAB — CYTOLOGY - PAP
Chlamydia: NEGATIVE
Comment: NEGATIVE
Comment: NORMAL
Diagnosis: NEGATIVE
Neisseria Gonorrhea: NEGATIVE

## 2022-12-16 ENCOUNTER — Encounter: Payer: Self-pay | Admitting: Obstetrics

## 2023-03-17 DIAGNOSIS — F411 Generalized anxiety disorder: Secondary | ICD-10-CM | POA: Diagnosis not present

## 2023-04-02 DIAGNOSIS — F411 Generalized anxiety disorder: Secondary | ICD-10-CM | POA: Diagnosis not present

## 2023-04-07 DIAGNOSIS — F411 Generalized anxiety disorder: Secondary | ICD-10-CM | POA: Diagnosis not present

## 2023-04-16 DIAGNOSIS — F411 Generalized anxiety disorder: Secondary | ICD-10-CM | POA: Diagnosis not present

## 2023-05-05 DIAGNOSIS — F411 Generalized anxiety disorder: Secondary | ICD-10-CM | POA: Diagnosis not present

## 2023-05-14 DIAGNOSIS — F411 Generalized anxiety disorder: Secondary | ICD-10-CM | POA: Diagnosis not present

## 2023-05-28 DIAGNOSIS — F411 Generalized anxiety disorder: Secondary | ICD-10-CM | POA: Diagnosis not present

## 2023-06-24 DIAGNOSIS — F411 Generalized anxiety disorder: Secondary | ICD-10-CM | POA: Diagnosis not present

## 2023-06-25 DIAGNOSIS — F411 Generalized anxiety disorder: Secondary | ICD-10-CM | POA: Diagnosis not present

## 2023-07-09 DIAGNOSIS — F411 Generalized anxiety disorder: Secondary | ICD-10-CM | POA: Diagnosis not present

## 2023-07-23 DIAGNOSIS — F411 Generalized anxiety disorder: Secondary | ICD-10-CM | POA: Diagnosis not present

## 2023-08-13 DIAGNOSIS — F411 Generalized anxiety disorder: Secondary | ICD-10-CM | POA: Diagnosis not present

## 2023-08-27 DIAGNOSIS — F411 Generalized anxiety disorder: Secondary | ICD-10-CM | POA: Diagnosis not present

## 2023-09-14 DIAGNOSIS — F411 Generalized anxiety disorder: Secondary | ICD-10-CM | POA: Diagnosis not present

## 2023-09-15 DIAGNOSIS — F411 Generalized anxiety disorder: Secondary | ICD-10-CM | POA: Diagnosis not present

## 2023-09-23 DIAGNOSIS — F411 Generalized anxiety disorder: Secondary | ICD-10-CM | POA: Diagnosis not present

## 2023-10-09 DIAGNOSIS — F411 Generalized anxiety disorder: Secondary | ICD-10-CM | POA: Diagnosis not present

## 2023-10-14 DIAGNOSIS — F411 Generalized anxiety disorder: Secondary | ICD-10-CM | POA: Diagnosis not present

## 2023-10-28 DIAGNOSIS — F411 Generalized anxiety disorder: Secondary | ICD-10-CM | POA: Diagnosis not present

## 2023-12-08 DIAGNOSIS — F411 Generalized anxiety disorder: Secondary | ICD-10-CM | POA: Diagnosis not present

## 2024-01-08 DIAGNOSIS — Z0000 Encounter for general adult medical examination without abnormal findings: Secondary | ICD-10-CM | POA: Diagnosis not present

## 2024-01-08 DIAGNOSIS — Z131 Encounter for screening for diabetes mellitus: Secondary | ICD-10-CM | POA: Diagnosis not present

## 2024-01-08 DIAGNOSIS — Z23 Encounter for immunization: Secondary | ICD-10-CM | POA: Diagnosis not present
